# Patient Record
Sex: Female | Born: 1990
Health system: Southern US, Community
[De-identification: ages and names within clinical notes are randomized; demographics above are authoritative.]

## PROBLEM LIST (undated history)

## (undated) DIAGNOSIS — T7840XA Allergy, unspecified, initial encounter: Secondary | ICD-10-CM

## (undated) HISTORY — DX: Allergy, unspecified, initial encounter: T78.40XA

---

## 2013-04-09 ENCOUNTER — Encounter (HOSPITAL_COMMUNITY): Payer: Self-pay | Admitting: Emergency Medicine

## 2013-04-09 ENCOUNTER — Emergency Department (HOSPITAL_COMMUNITY)
Admission: EM | Admit: 2013-04-09 | Discharge: 2013-04-09 | Disposition: A | Discharge status: Home / Self Care | Payer: Federal, State, Local not specified - PPO | Source: Home / Self Care

## 2013-04-09 DIAGNOSIS — R1110 Vomiting, unspecified: Secondary | ICD-10-CM

## 2013-04-09 MED ORDER — ONDANSETRON 4 MG PO TBDP
8.0000 mg | ORAL_TABLET | Freq: Once | ORAL | Status: AC
  Administered 2013-04-09: 8 mg via ORAL

## 2013-04-09 MED ORDER — ONDANSETRON 4 MG PO TBDP
ORAL_TABLET | ORAL | Status: AC
  Filled 2013-04-09: qty 2

## 2013-04-09 MED ORDER — ONDANSETRON HCL 4 MG PO TABS: 4.0000 mg | ORAL_TABLET | Freq: Four times a day (QID) | ORAL | Status: DC

## 2013-04-09 NOTE — ED Notes (Signed)
Vomiting, onset this morning 

## 2013-04-09 NOTE — ED Notes (Signed)
Spoke to dr kindl regarding note for work.  May go back to work [employer]

## 2013-04-09 NOTE — ED Provider Notes (Signed)
CSN: [id number]     Arrival date & time 04/09/13  1552 History   None    Chief Complaint  Patient presents with  . Emesis   (Consider location/radiation/quality/duration/timing/severity/associated sxs/prior Treatment) Patient is a 22 y.o. female presenting with vomiting. The history is provided by the patient.  Emesis Severity:  Mild Duration:  10 hours Timing:  Intermittent Quality:  Stomach contents Progression:  Unchanged Chronicity:  New Recent urination:  Normal Associated symptoms: no diarrhea   Risk factors: not pregnant now and no sick contacts   Risk factors comment:  Onset of menses today   History reviewed. No pertinent past medical history. History reviewed. No pertinent past surgical history. No family history on file. History  Substance Use Topics  . Smoking status: Never Smoker   . Smokeless tobacco: Not on file  . Alcohol Use: Yes   OB History   Grav Para Term Preterm Abortions TAB SAB Ect Mult Living                 Review of Systems  Constitutional: Negative.   HENT: Negative.   Gastrointestinal: Positive for nausea and vomiting. Negative for diarrhea, constipation and blood in stool.  Genitourinary: Positive for pelvic pain.    Allergies  Review of patient's allergies indicates no known allergies.  Home Medications   Current Outpatient Rx  Name  Route  Sig  Dispense  Refill  . ondansetron (ZOFRAN) 4 MG tablet   Oral   Take 1 tablet (4 mg total) by mouth every 6 (six) hours. Prn n/v   8 tablet   0    BP 120/67  Pulse 62  Temp(Src) 98.4 F (36.9 C) (Oral)  Resp 15  SpO2 99%  LMP 04/09/2013 Physical Exam  Nursing note and vitals reviewed. Constitutional: She appears well-developed and well-nourished.  Abdominal: Soft. She exhibits no distension and no mass. Bowel sounds are increased. There is generalized tenderness. There is no rigidity, no rebound, no guarding and no CVA tenderness.  Skin: Skin is warm and dry.    ED Course   Procedures (including critical care time) Labs Review Labs Reviewed - No data to display Imaging Review No results found.  EKG Interpretation    Date/Time:    Ventricular Rate:    PR Interval:    QRS Duration:   QT Interval:    QTC Calculation:   R Axis:     Text Interpretation:              MDM   1. Vomiting alone        Linna Hoff, MD 04/09/13 [phone]

## 2013-04-09 NOTE — Discharge Instructions (Signed)
Clear liquid diet tonight as tolerated, advance on tues as improved, use medicine as needed, return or see your doctor if any problems.  °

## 2013-09-14 ENCOUNTER — Ambulatory Visit: Payer: Federal, State, Local not specified - PPO | Admitting: Family Medicine

## 2013-09-14 ENCOUNTER — Ambulatory Visit (INDEPENDENT_AMBULATORY_CARE_PROVIDER_SITE_OTHER): Payer: Federal, State, Local not specified - PPO | Admitting: Family Medicine

## 2013-09-14 ENCOUNTER — Encounter: Payer: Self-pay | Admitting: Family Medicine

## 2013-09-14 VITALS — Ht 64.5 in | Wt 130.0 lb

## 2013-09-14 DIAGNOSIS — IMO0001 Reserved for inherently not codable concepts without codable children: Secondary | ICD-10-CM

## 2013-09-14 DIAGNOSIS — Z7189 Other specified counseling: Secondary | ICD-10-CM

## 2013-09-14 DIAGNOSIS — Z23 Encounter for immunization: Secondary | ICD-10-CM

## 2013-09-14 DIAGNOSIS — Z7689 Persons encountering health services in other specified circumstances: Secondary | ICD-10-CM

## 2013-09-14 DIAGNOSIS — Z309 Encounter for contraceptive management, unspecified: Secondary | ICD-10-CM

## 2013-09-14 DIAGNOSIS — Z3042 Encounter for surveillance of injectable contraceptive: Secondary | ICD-10-CM | POA: Insufficient documentation

## 2013-09-14 LAB — CBC WITH DIFFERENTIAL/PLATELET
BASOS ABS: 0 10*3/uL (ref 0.0–0.1)
Basophils Relative: 0 % (ref 0–1)
EOS ABS: 0.4 10*3/uL (ref 0.0–0.7)
Eosinophils Relative: 4 % (ref 0–5)
HCT: 33.5 % — ABNORMAL LOW (ref 36.0–46.0)
HEMOGLOBIN: 11.5 g/dL — AB (ref 12.0–15.0)
Lymphocytes Relative: 31 % (ref 12–46)
Lymphs Abs: 2.8 10*3/uL (ref 0.7–4.0)
MCH: 30.3 pg (ref 26.0–34.0)
MCHC: 34.3 g/dL (ref 30.0–36.0)
MCV: 88.4 fL (ref 78.0–100.0)
Monocytes Absolute: 0.6 10*3/uL (ref 0.1–1.0)
Monocytes Relative: 7 % (ref 3–12)
Neutro Abs: 5.3 10*3/uL (ref 1.7–7.7)
Neutrophils Relative %: 58 % (ref 43–77)
PLATELETS: 213 10*3/uL (ref 150–400)
RBC: 3.79 MIL/uL — ABNORMAL LOW (ref 3.87–5.11)
RDW: 15.6 % — AB (ref 11.5–15.5)
WBC: 9.1 10*3/uL (ref 4.0–10.5)

## 2013-09-14 LAB — POCT URINE PREGNANCY: Preg Test, Ur: NEGATIVE

## 2013-09-14 MED ORDER — MEDROXYPROGESTERONE ACETATE 150 MG/ML IM SUSP
150.0000 mg | Freq: Once | INTRAMUSCULAR | Status: AC
  Administered 2013-09-14: 150 mg via INTRAMUSCULAR

## 2013-09-14 NOTE — Patient Instructions (Signed)
Robin Flynn, it was a pleasure seeing you today. Today we established your care. I am getting some blood work today. If there is anything abnormal, you will get a call. You are also getting the Depo-Provera and Gardisil shots today.  Please see me in one year, or sooner if needed. You will be due for your 3 year pap smear next year. I look forward to seeing you again. 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  If you have any questions or concerns, please do not hesitate to call the office at [phone].  Sincerely,  Jacquelin Hawkingalph Ayris Carano, MD

## 2013-09-14 NOTE — Assessment & Plan Note (Signed)
Patient receiving initial shot today. Will follow-up in 3 months.

## 2013-09-14 NOTE — Progress Notes (Signed)
   Subjective:    Patient ID: Robin Flynn, female    DOB: [birth year], 23 y.o.   MRN: [id number]  HPI Patient presents today for a new patient visit and establishment of care. Her additional concerns include getting a Depo-Provera shot. She would like the shot to control her menstrual cycles and is not seeking contraceptive uses. No history of blood clots.  Past Medical History  Diagnosis Date  . Allergy    Family History  Problem Relation Age of Onset  . Cancer Paternal Grandmother     lung   History   Social History  . Marital Status: Significant Other: Female   Social History Main Topics  . Smoking status: Smokes one cigarette infrequently  . Smokeless tobacco: Not on file  . Alcohol Use: 1.5 oz/week    3 drink(s) per week  . Drug Use: No  . Sexual Activity: Yes    Review of Systems  All other systems reviewed and are negative.      Objective:   Physical Exam  Constitutional: She is oriented to person, place, and time. She appears well-developed and well-nourished.  HENT:  Right Ear: Tympanic membrane normal.  Nose: Nose normal.  Mouth/Throat: Oropharynx is clear and moist and mucous membranes are normal.  Right TM not visualized due to cerumen  Eyes: Conjunctivae and EOM are normal. Pupils are equal, round, and reactive to light.  Neck: Normal range of motion. Neck supple.  Cardiovascular: Normal rate, regular rhythm, normal heart sounds, intact distal pulses and normal pulses.   Pulmonary/Chest: Effort normal and breath sounds normal. No respiratory distress. She has no wheezes.  Abdominal: Soft. Normal appearance and bowel sounds are normal. There is no hepatosplenomegaly. There is no tenderness.  Musculoskeletal: Normal range of motion.  Lymphadenopathy:    She has no cervical adenopathy.  Neurological: She is alert and oriented to person, place, and time.  Could not illicit DTRs  Skin: Skin is warm, dry and intact.  Many tattoos        Assessment &  Plan:

## 2013-09-15 LAB — COMPREHENSIVE METABOLIC PANEL
ALBUMIN: 3.9 g/dL (ref 3.5–5.2)
ALK PHOS: 61 U/L (ref 39–117)
ALT: 10 U/L (ref 0–35)
AST: 17 U/L (ref 0–37)
BILIRUBIN TOTAL: 0.4 mg/dL (ref 0.2–1.2)
BUN: 8 mg/dL (ref 6–23)
CO2: 29 mEq/L (ref 19–32)
Calcium: 8.8 mg/dL (ref 8.4–10.5)
Chloride: 102 mEq/L (ref 96–112)
Creat: 0.59 mg/dL (ref 0.50–1.10)
GLUCOSE: 80 mg/dL (ref 70–99)
POTASSIUM: 3.8 meq/L (ref 3.5–5.3)
Sodium: 137 mEq/L (ref 135–145)
TOTAL PROTEIN: 6.8 g/dL (ref 6.0–8.3)

## 2013-09-15 LAB — RPR

## 2013-09-15 LAB — HIV ANTIBODY (ROUTINE TESTING W REFLEX): HIV 1&2 Ab, 4th Generation: NONREACTIVE

## 2013-09-17 ENCOUNTER — Telehealth: Payer: Self-pay | Admitting: *Deleted

## 2013-09-17 NOTE — Telephone Encounter (Signed)
Left message on patient's voicemail.Robin Flynn  

## 2013-09-17 NOTE — Telephone Encounter (Signed)
Message copied by Tanna SavoyPROPOSITO, Robin Flynn on Mon Sep 17, 2013  9:09 AM ------      Message from: Jacquelin HawkingNETTEY, Robin A      Created: Sun Sep 16, 2013 10:11 PM       Patient has some mild anemia, which is generally expected in a menstruating female. Rest of labs are normal. HIV and syphilis screen are negative. Will need to inform patient that she needs to follow-up with me in 3 months since she is getting Depo-Provera. ------

## 2013-09-20 ENCOUNTER — Encounter: Payer: Self-pay | Admitting: Family Medicine

## 2013-09-20 ENCOUNTER — Ambulatory Visit (INDEPENDENT_AMBULATORY_CARE_PROVIDER_SITE_OTHER): Payer: Federal, State, Local not specified - PPO | Admitting: Family Medicine

## 2013-09-20 VITALS — BP 115/55 | HR 67 | Temp 98.2°F | Ht 64.5 in | Wt 131.0 lb

## 2013-09-20 DIAGNOSIS — M549 Dorsalgia, unspecified: Secondary | ICD-10-CM

## 2013-09-20 LAB — TSH: TSH: 0.315 u[IU]/mL — ABNORMAL LOW (ref 0.350–4.500)

## 2013-09-20 LAB — POCT HEMOGLOBIN: HEMOGLOBIN: 11.6 g/dL — AB (ref 12.2–16.2)

## 2013-09-20 LAB — D-DIMER, QUANTITATIVE: D-Dimer, Quant: 0.23 ug/mL-FEU (ref 0.00–0.48)

## 2013-09-20 MED ORDER — IBUPROFEN 600 MG PO TABS: 600.0000 mg | ORAL_TABLET | Freq: Four times a day (QID) | ORAL | Status: DC | PRN

## 2013-09-20 NOTE — Patient Instructions (Signed)
Robin Flynn, it was a pleasure seeing you today. Today we talked about your back pain. On your examination, it seems like it is related to your muscles/bones. I got some blood from you to make sure there is not something worse. In the mean time I will prescribe ibuprofen for you to hopefully help with what I suspect is inflammation.  Please schedule an appointment to see me in one week. If your symptoms go away, you can call to reschedule for 4 weeks.  If you have any questions or concerns, please do not hesitate to call the office at [phone].  Sincerely,  Jacquelin Hawkingalph Chiyeko Ferre, MD

## 2013-09-22 DIAGNOSIS — M549 Dorsalgia, unspecified: Secondary | ICD-10-CM | POA: Insufficient documentation

## 2013-09-22 NOTE — Progress Notes (Signed)
   Subjective:    Patient ID: Robin Flynn, female    DOB: August 01, 1990, 23 y.o.   MRN: [id number]  HPI  Patient presents to clinic today with a one week history of back pack. Pain is sharp and located in thoracic spine area. Pain is constant and located throughout thoracic back. Sometimes worse on right and sometimes worse on left. Pain also in chest at times. 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 She has applied heat and soaking which has helped.   Past Medical History  Diagnosis Date  . Allergy    Review of Systems  Respiratory: Negative for shortness of breath.   Cardiovascular: Positive for chest pain. Negative for palpitations.  Musculoskeletal: Positive for back pain.  All other systems reviewed and are negative.      Objective:  BP 115/55  Pulse 67  Temp(Src) 98.2 F (36.8 C) (Oral)  Ht 5' 4.5" (1.638 m)  Wt 131 lb (59.421 kg)  BMI 22.15 kg/m2  SpO2 99%  LMP 09/13/2013  Physical Exam  Constitutional: She appears well-developed and well-nourished.  Cardiovascular: Normal rate, regular rhythm and normal heart sounds.   Pulmonary/Chest: Effort normal and breath sounds normal. No respiratory distress. She has no wheezes. She has no rales. She exhibits tenderness.  Musculoskeletal:       Thoracic back: She exhibits tenderness (tenderness across whole thoracic back. No bony tenderness. No signs of trauma). She exhibits no swelling, no edema and no deformity.          Assessment & Plan:

## 2013-09-22 NOTE — Assessment & Plan Note (Signed)
Seems musculoskeletal. Will get d-dimer to rule out PE. Will also get TSH. Ibuprofen for pain control.

## 2013-12-06 ENCOUNTER — Ambulatory Visit (INDEPENDENT_AMBULATORY_CARE_PROVIDER_SITE_OTHER): Payer: Federal, State, Local not specified - PPO | Admitting: *Deleted

## 2013-12-06 DIAGNOSIS — Z3049 Encounter for surveillance of other contraceptives: Secondary | ICD-10-CM | POA: Diagnosis not present

## 2013-12-06 DIAGNOSIS — Z3042 Encounter for surveillance of injectable contraceptive: Secondary | ICD-10-CM

## 2013-12-06 MED ORDER — MEDROXYPROGESTERONE ACETATE 150 MG/ML IM SUSP
150.0000 mg | Freq: Once | INTRAMUSCULAR | Status: AC
  Administered 2013-12-06: 150 mg via INTRAMUSCULAR

## 2013-12-06 NOTE — Progress Notes (Signed)
Patient in today for depo-provera. Injection given in left ventrogluteal, patient without complaints. Next depo due: October 15 - October 29, patient aware.

## 2014-02-28 ENCOUNTER — Ambulatory Visit (INDEPENDENT_AMBULATORY_CARE_PROVIDER_SITE_OTHER): Payer: Federal, State, Local not specified - PPO | Admitting: *Deleted

## 2014-02-28 DIAGNOSIS — Z3042 Encounter for surveillance of injectable contraceptive: Secondary | ICD-10-CM | POA: Diagnosis not present

## 2014-02-28 MED ORDER — MEDROXYPROGESTERONE ACETATE 150 MG/ML IM SUSP
150.0000 mg | Freq: Once | INTRAMUSCULAR | Status: AC
  Administered 2014-02-28: 150 mg via INTRAMUSCULAR

## 2014-02-28 NOTE — Progress Notes (Signed)
   Pt in for Depo Provera injection.  Pt tolerated Depo injection. Depo given right upper outer quadrant.  Next injection due Jan 7-May 30, 2014.  Reminder card given. Clovis PuMartin, Tamika L, RN

## 2014-05-20 ENCOUNTER — Ambulatory Visit: Payer: Federal, State, Local not specified - PPO

## 2014-05-22 ENCOUNTER — Ambulatory Visit (INDEPENDENT_AMBULATORY_CARE_PROVIDER_SITE_OTHER): Payer: BLUE CROSS/BLUE SHIELD | Admitting: *Deleted

## 2014-05-22 DIAGNOSIS — Z3042 Encounter for surveillance of injectable contraceptive: Secondary | ICD-10-CM

## 2014-05-22 MED ORDER — MEDROXYPROGESTERONE ACETATE 150 MG/ML IM SUSP
150.0000 mg | Freq: Once | INTRAMUSCULAR | Status: AC
  Administered 2014-05-22: 150 mg via INTRAMUSCULAR

## 2014-05-22 NOTE — Progress Notes (Signed)
   Pt in for Depo Provera injection.  Pt tolerated Depo injection. Depo given left upper outer quadrant.  Next injection due March 31-August 22, 2014.  Reminder card given. Martin, Tamika L, RN   

## 2014-09-17 ENCOUNTER — Ambulatory Visit (INDEPENDENT_AMBULATORY_CARE_PROVIDER_SITE_OTHER): Payer: Federal, State, Local not specified - PPO | Admitting: Family Medicine

## 2014-09-17 ENCOUNTER — Ambulatory Visit (INDEPENDENT_AMBULATORY_CARE_PROVIDER_SITE_OTHER): Payer: Federal, State, Local not specified - PPO | Admitting: *Deleted

## 2014-09-17 ENCOUNTER — Encounter: Payer: Self-pay | Admitting: Family Medicine

## 2014-09-17 VITALS — BP 117/76 | HR 79 | Temp 100.0°F | Ht 64.5 in | Wt 129.0 lb

## 2014-09-17 DIAGNOSIS — J020 Streptococcal pharyngitis: Secondary | ICD-10-CM | POA: Diagnosis not present

## 2014-09-17 DIAGNOSIS — J069 Acute upper respiratory infection, unspecified: Secondary | ICD-10-CM

## 2014-09-17 DIAGNOSIS — Z3042 Encounter for surveillance of injectable contraceptive: Secondary | ICD-10-CM

## 2014-09-17 LAB — POCT RAPID STREP A (OFFICE): Rapid Strep A Screen: NEGATIVE

## 2014-09-17 LAB — POCT URINE PREGNANCY: Preg Test, Ur: NEGATIVE

## 2014-09-17 MED ORDER — BENZONATATE 100 MG PO CAPS: 100.0000 mg | ORAL_CAPSULE | Freq: Three times a day (TID) | ORAL | Status: DC | PRN

## 2014-09-17 MED ORDER — FLUTICASONE PROPIONATE 50 MCG/ACT NA SUSP: 2.0000 | Freq: Every day | NASAL | Status: DC

## 2014-09-17 MED ORDER — IBUPROFEN 600 MG PO TABS: 600.0000 mg | ORAL_TABLET | Freq: Four times a day (QID) | ORAL | Status: DC | PRN

## 2014-09-17 MED ORDER — MEDROXYPROGESTERONE ACETATE 150 MG/ML IM SUSP
150.0000 mg | Freq: Once | INTRAMUSCULAR | Status: AC
  Administered 2014-09-17: 150 mg via INTRAMUSCULAR

## 2014-09-17 NOTE — Patient Instructions (Signed)
It was nice to see you today.  Your strep test was negative.  This is viral in nature.  I have prescribed Ibuprofen, Tessalon (for cough) and Flonase (for your runny nose/congestion). Use them as prescribed.  Follow up if you worsen or fail to improve.   Take care  Dr. Adriana Simasook

## 2014-09-17 NOTE — Progress Notes (Signed)
   Subjective:    Patient ID: Pieter Partridgeachel Snead, female    DOB: [birth year], 24 y.o.   MRN: [id number]  HPI 24 year old female presents for a same day appointment with complaints of sore throat.  1) Sore Throat  Started on Saturday.  Mild to moderate in severity.  No exacerbating or relieving factors.  She notes associated runny nose, cough.  No fever, chills.  No sick contacts.  No interventions tried.   Review of Systems  Constitutional: Negative for fever and chills.  HENT: Positive for rhinorrhea and sore throat.   Respiratory: Positive for cough.       Objective:   Physical Exam Filed Vitals:   09/17/14 1154  BP: 117/76  Pulse: 79  Temp: 100 F (37.8 C)   Vital signs reviewed.  Exam: General: well appearing female in NAD.  HEENT: NCAT. Normal TM's bilaterally. Oropharynx mildly erythematous. No tonsillar exudate.  Cardiovascular: RRR. No murmurs, rubs, or gallops. Respiratory: CTAB. No rales, rhonchi, or wheeze.    Assessment & Plan:  See Problem List

## 2014-09-17 NOTE — Progress Notes (Signed)
   Pt late for Depo Provera injection.  Pregnancy test ordered; result negative. Pt tolerated Depo injection. Depo given right upper outer quadrant.  Next injection due July 26-December 17, 2014.  Reminder card given. Clovis PuMartin, Nahiara Kretzschmar L, RN

## 2014-09-17 NOTE — Assessment & Plan Note (Signed)
Rapid strep negative. I suspect that her symptoms are secondary to viral URI. Supportive care; Also, PRN Ibuprofen for sore throat, Tessalon for cough, Flonase for congestion/rhinorrhea.

## 2015-01-08 ENCOUNTER — Ambulatory Visit (INDEPENDENT_AMBULATORY_CARE_PROVIDER_SITE_OTHER): Payer: Federal, State, Local not specified - PPO | Admitting: *Deleted

## 2015-01-08 DIAGNOSIS — Z3042 Encounter for surveillance of injectable contraceptive: Secondary | ICD-10-CM | POA: Diagnosis not present

## 2015-01-08 LAB — POCT URINE PREGNANCY: Preg Test, Ur: NEGATIVE

## 2015-01-08 MED ORDER — MEDROXYPROGESTERONE ACETATE 150 MG/ML IM SUSP
150.0000 mg | Freq: Once | INTRAMUSCULAR | Status: AC
  Administered 2015-01-08: 150 mg via INTRAMUSCULAR

## 2015-01-08 NOTE — Progress Notes (Signed)
   Pt late for Depo Provera injection.  Pregnancy test ordered; results negative. Pt tolerated Depo injection. Depo given left upper outer quadrant.  Next injection due Nov. 16-Nov. 30, 2016.  Reminder card given. Clovis Pu, RN

## 2015-01-22 ENCOUNTER — Encounter (HOSPITAL_COMMUNITY): Payer: Self-pay | Admitting: Emergency Medicine

## 2015-01-22 ENCOUNTER — Emergency Department (HOSPITAL_COMMUNITY)
Admission: EM | Admit: 2015-01-22 | Discharge: 2015-01-22 | Disposition: A | Discharge status: Home / Self Care | Payer: Federal, State, Local not specified - PPO | Source: Home / Self Care | Attending: Emergency Medicine | Admitting: Emergency Medicine

## 2015-01-22 DIAGNOSIS — R6889 Other general symptoms and signs: Secondary | ICD-10-CM

## 2015-01-22 LAB — POCT RAPID STREP A: STREPTOCOCCUS, GROUP A SCREEN (DIRECT): NEGATIVE

## 2015-01-22 MED ORDER — CETIRIZINE HCL 10 MG PO TABS: 10.0000 mg | ORAL_TABLET | Freq: Every day | ORAL | Status: DC

## 2015-01-22 MED ORDER — IPRATROPIUM BROMIDE 0.06 % NA SOLN: 2.0000 | Freq: Four times a day (QID) | NASAL | Status: DC

## 2015-01-22 NOTE — Discharge Instructions (Signed)
You have the flu or something similar to the flu. This will run its course in 7-10 days. Takes cetirizine daily. Use Atrovent nasal spray 4 times a day. This will help with the nasal symptoms. Get over-the-counter Afrin nasal spray. This will help with the stuffy nose. You can only use this for 3 days. You can take Tylenol or ibuprofen as needed for fevers. You should start to feel better over the weekend. Follow-up as needed.

## 2015-01-22 NOTE — ED Provider Notes (Signed)
CSN: [id number]     Arrival date & time 01/22/15  1310 History   First MD Initiated Contact with Patient 01/22/15 1356     Chief Complaint  Patient presents with  . Otalgia  . Sore Throat   (Consider location/radiation/quality/duration/timing/severity/associated sxs/prior Treatment) HPI  She is a 24 year old woman here for evaluation of sore throat. She states her symptoms started on Sunday with sore throat, nasal congestion, rhinorrhea, subjective fevers, body aches, and cough. She also reports bilateral ear pain, worse on the right. She states this is been a ongoing problem for several months, but got worse in the last few days. She denies any shortness of breath or wheezing. She has tried Tylenol, Advil, DayQuil, NyQuil without improvement.  Past Medical History  Diagnosis Date  . Allergy    History reviewed. No pertinent past surgical history. Family History  Problem Relation Age of Onset  . Cancer Paternal Grandmother     lung   Social History  Substance Use Topics  . Smoking status: Never Smoker   . Smokeless tobacco: None  . Alcohol Use: 1.5 oz/week    3 drink(s) per week   OB History    No data available     Review of Systems As in history of present illness Allergies  Review of patient's allergies indicates no known allergies.  Home Medications   Prior to Admission medications   Medication Sig Start Date End Date Taking? Authorizing Provider  cetirizine (ZYRTEC) 10 MG tablet Take 1 tablet (10 mg total) by mouth daily. 01/22/15   Charm Rings, MD  fluticasone (FLONASE) 50 MCG/ACT nasal spray Place 2 sprays into both nostrils daily. 09/17/14   Tommie Sams, DO  ibuprofen (ADVIL,MOTRIN) 600 MG tablet Take 1 tablet (600 mg total) by mouth every 6 (six) hours as needed. 09/17/14   Jayce G Cook, DO  ipratropium (ATROVENT) 0.06 % nasal spray Place 2 sprays into both nostrils 4 (four) times daily. 01/22/15   Charm Rings, MD   Meds Ordered and Administered this Visit   Medications - No data to display  BP 111/72 mmHg  Pulse 68  Temp(Src) 100.2 F (37.9 C) (Oral)  Resp 18  SpO2 100% No data found.   Physical Exam  Constitutional: She is oriented to person, place, and time. She appears well-developed and well-nourished. No distress.  HENT:  Mouth/Throat: Oropharynx is clear and moist. No oropharyngeal exudate.  Nasal discharge present. Left TM is retracted. Right ear canal is occluded by cerumen.  Eyes: Conjunctivae are normal.  Neck: Neck supple.  Cardiovascular: Normal rate, regular rhythm and normal heart sounds.   Pulmonary/Chest: Effort normal and breath sounds normal. No respiratory distress. She has no wheezes. She has no rales.  Lymphadenopathy:    She has no cervical adenopathy.  Neurological: She is alert and oriented to person, place, and time.    ED Course  Procedures (including critical care time)  Labs Review Labs Reviewed  POCT RAPID STREP A    Imaging Review No results found.    MDM   1. Flu-like symptoms    Right ear washed out.   Right tympanic membrane is slightly retracted, otherwise normal.  Symptoms are consistent with a flulike illness. She is outside the treatment window for Tamiflu. Symptomatic treatment with Atrovent nasal spray, cetirizine, and over-the-counter Afrin. School note provided. Follow-up as needed.    Charm Rings, MD 01/22/15 1500

## 2015-01-22 NOTE — ED Notes (Signed)
C/o bilateral ear pain and throat pain States she has throbbing ear pain Little cough Does have hx of ear not draining well Did vomit x 1

## 2015-01-24 LAB — CULTURE, GROUP A STREP: Strep A Culture: NEGATIVE

## 2015-01-24 NOTE — ED Notes (Signed)
Final report of strep negative  

## 2015-02-24 ENCOUNTER — Ambulatory Visit: Payer: Federal, State, Local not specified - PPO | Admitting: Family Medicine

## 2015-03-27 ENCOUNTER — Ambulatory Visit (INDEPENDENT_AMBULATORY_CARE_PROVIDER_SITE_OTHER): Payer: Federal, State, Local not specified - PPO | Admitting: *Deleted

## 2015-03-27 DIAGNOSIS — Z3042 Encounter for surveillance of injectable contraceptive: Secondary | ICD-10-CM | POA: Diagnosis not present

## 2015-03-27 MED ORDER — MEDROXYPROGESTERONE ACETATE 150 MG/ML IM SUSP
150.0000 mg | Freq: Once | INTRAMUSCULAR | Status: AC
  Administered 2015-03-27: 150 mg via INTRAMUSCULAR

## 2015-07-01 ENCOUNTER — Ambulatory Visit (INDEPENDENT_AMBULATORY_CARE_PROVIDER_SITE_OTHER): Payer: Federal, State, Local not specified - PPO | Admitting: *Deleted

## 2015-07-01 ENCOUNTER — Telehealth: Payer: Self-pay | Admitting: *Deleted

## 2015-07-01 DIAGNOSIS — Z3042 Encounter for surveillance of injectable contraceptive: Secondary | ICD-10-CM | POA: Diagnosis not present

## 2015-07-01 LAB — POCT URINE PREGNANCY: Preg Test, Ur: NEGATIVE

## 2015-07-01 MED ORDER — MEDROXYPROGESTERONE ACETATE 150 MG/ML IM SUSP
150.0000 mg | Freq: Once | INTRAMUSCULAR | Status: AC
  Administered 2015-07-01: 150 mg via INTRAMUSCULAR

## 2015-07-01 NOTE — Telephone Encounter (Signed)
Spoke with patient today regarding physical and depo provera.  Appt made for Thursday 07/03/15 for annual physical.  Ok to give Depo Provera today per Dr. Jennette Kettle.  Daphine Deutscher, Bronson Ing, RN

## 2015-07-01 NOTE — Progress Notes (Signed)
  Pt late for Depo Provera injection.  Patient does not have a current depo provera order; order given x 1 by Dr. Jennette Kettle.  Appointment for physical 07/03/15 at 2:30 PM.  Pregnancy test order; results negative. Pt tolerated Depo injection. Depo given right upper outer quadrant.  Next injection due May 9-23, 2017.  Reminder card given. Clovis Pu, RN

## 2015-07-01 NOTE — Telephone Encounter (Signed)
Left voice message for patient that she does not have a current order for Depo Provera.  Patient to need to see provider for physical and current Depo Provera order.  Clovis Pu, RN

## 2015-07-03 ENCOUNTER — Other Ambulatory Visit (HOSPITAL_COMMUNITY)
Admission: RE | Admit: 2015-07-03 | Discharge: 2015-07-03 | Disposition: A | Discharge status: Home / Self Care | Payer: Federal, State, Local not specified - PPO | Source: Ambulatory Visit | Attending: Family Medicine | Admitting: Family Medicine

## 2015-07-03 ENCOUNTER — Ambulatory Visit (INDEPENDENT_AMBULATORY_CARE_PROVIDER_SITE_OTHER): Payer: Federal, State, Local not specified - PPO | Admitting: Family Medicine

## 2015-07-03 ENCOUNTER — Other Ambulatory Visit: Payer: Self-pay | Admitting: Family Medicine

## 2015-07-03 ENCOUNTER — Encounter: Payer: Self-pay | Admitting: Family Medicine

## 2015-07-03 VITALS — BP 114/56 | HR 53 | Temp 98.9°F | Wt 126.0 lb

## 2015-07-03 DIAGNOSIS — M25531 Pain in right wrist: Secondary | ICD-10-CM

## 2015-07-03 DIAGNOSIS — Z124 Encounter for screening for malignant neoplasm of cervix: Secondary | ICD-10-CM

## 2015-07-03 DIAGNOSIS — Z01419 Encounter for gynecological examination (general) (routine) without abnormal findings: Secondary | ICD-10-CM

## 2015-07-03 DIAGNOSIS — G8929 Other chronic pain: Secondary | ICD-10-CM

## 2015-07-03 DIAGNOSIS — Z113 Encounter for screening for infections with a predominantly sexual mode of transmission: Secondary | ICD-10-CM | POA: Diagnosis present

## 2015-07-03 DIAGNOSIS — Z3042 Encounter for surveillance of injectable contraceptive: Secondary | ICD-10-CM

## 2015-07-03 DIAGNOSIS — Z23 Encounter for immunization: Secondary | ICD-10-CM | POA: Diagnosis not present

## 2015-07-03 LAB — COMPREHENSIVE METABOLIC PANEL
ALBUMIN: 4.8 g/dL (ref 3.6–5.1)
ALT: 15 U/L (ref 6–29)
AST: 22 U/L (ref 10–30)
Alkaline Phosphatase: 64 U/L (ref 33–115)
BUN: 8 mg/dL (ref 7–25)
CALCIUM: 9.6 mg/dL (ref 8.6–10.2)
CHLORIDE: 100 mmol/L (ref 98–110)
CO2: 26 mmol/L (ref 20–31)
Creat: 0.67 mg/dL (ref 0.50–1.10)
Glucose, Bld: 67 mg/dL (ref 65–99)
POTASSIUM: 3.3 mmol/L — AB (ref 3.5–5.3)
Sodium: 137 mmol/L (ref 135–146)
TOTAL PROTEIN: 8 g/dL (ref 6.1–8.1)
Total Bilirubin: 0.5 mg/dL (ref 0.2–1.2)

## 2015-07-03 LAB — LIPID PANEL
CHOL/HDL RATIO: 2.9 ratio (ref <=5.0)
CHOLESTEROL: 163 mg/dL (ref 125–200)
HDL: 56 mg/dL (ref >=46)
LDL CALC: 93 mg/dL (ref <130)
TRIGLYCERIDES: 68 mg/dL (ref <150)
VLDL: 14 mg/dL (ref <30)

## 2015-07-03 LAB — TSH: TSH: 0.37 m[IU]/L — AB

## 2015-07-03 LAB — CBC
HCT: 37.4 % (ref 36.0–46.0)
HEMOGLOBIN: 12.6 g/dL (ref 12.0–15.0)
MCH: 30.1 pg (ref 26.0–34.0)
MCHC: 33.7 g/dL (ref 30.0–36.0)
MCV: 89.3 fL (ref 78.0–100.0)
MPV: 11.3 fL (ref 8.6–12.4)
Platelets: 235 10*3/uL (ref 150–400)
RBC: 4.19 MIL/uL (ref 3.87–5.11)
RDW: 14.2 % (ref 11.5–15.5)
WBC: 11.4 10*3/uL — ABNORMAL HIGH (ref 4.0–10.5)

## 2015-07-03 MED ORDER — MEDROXYPROGESTERONE ACETATE 150 MG/ML IM SUSP: 150.0000 mg | INTRAMUSCULAR | Status: AC

## 2015-07-03 MED ORDER — DICLOFENAC SODIUM 75 MG PO TBEC: 75.0000 mg | DELAYED_RELEASE_TABLET | Freq: Two times a day (BID) | ORAL | Status: DC

## 2015-07-03 NOTE — Progress Notes (Signed)
  Subjective:     Robin Flynn is a 25 y.o. female and is here for a comprehensive physical exam. The patient reports problems - reports wrist pain on right for 2 months. No known injury. worse with dorsiflexion. Has been to urgent care with negative x-rays. Desires STD screen..  Social History   Social History  . Marital Status: Significant Other    Spouse Name: N/A  . Number of Children: N/A  . Years of Education: N/A   Occupational History  . Not on file.   Social History Main Topics  . Smoking status: Never Smoker   . Smokeless tobacco: Not on file  . Alcohol Use: 1.5 oz/week    3 drink(s) per week  . Drug Use: No  . Sexual Activity: Yes   Other Topics Concern  . Not on file   Social History Narrative   Health Maintenance  Topic Date Due  . Janet Berlin  05/20/2009  . PAP SMEAR  05/21/2011  . INFLUENZA VACCINE  07/02/2016 (Originally 12/09/2014)  . HIV Screening  Completed    The following portions of the patient's history were reviewed and updated as appropriate: allergies, current medications, past family history, past medical history, past social history, past surgical history and problem list.  Review of Systems Pertinent items noted in HPI and remainder of comprehensive ROS otherwise negative.   Objective:    BP 114/56 mmHg  Pulse 53  Temp(Src) 98.9 F (37.2 C) (Oral)  Wt 126 lb (57.153 kg) General appearance: alert, cooperative and appears stated age Head: Normocephalic, without obvious abnormality, atraumatic Neck: no adenopathy, supple, symmetrical, trachea midline and thyroid not enlarged, symmetric, no tenderness/mass/nodules Lungs: clear to auscultation bilaterally Breasts: normal appearance, no masses or tenderness Heart: regular rate and rhythm, S1, S2 normal, no murmur, click, rub or gallop Abdomen: soft, non-tender; bowel sounds normal; no masses,  no organomegaly Pelvic: cervix normal in appearance, external genitalia normal, no adnexal masses  or tenderness, no cervical motion tenderness, uterus normal size, shape, and consistency and vagina normal without discharge Extremities: extremities normal, atraumatic, no cyanosis or edema Pulses: 2+ and symmetric Skin: Skin color, texture, turgor normal. No rashes or lesions Lymph nodes: Cervical, supraclavicular, and axillary nodes normal. Neurologic: Grossly normal    Assessment:    Healthy female exam.      Plan:     1. Encounter for surveillance of injectable contraceptive Continue Depo--no cycles with this - medroxyPROGESTERone (DEPO-PROVERA) 150 MG/ML injection; Inject 1 mL (150 mg total) into the muscle every 3 (three) months.  Dispense: 1 mL; Refill: 11  2. Screen for STD (sexually transmitted disease)  - HIV antibody - RPR - Hepatitis B surface antigen - Hepatitis C antibody  3. Screening for malignant neoplasm of cervix  - Cytology - PAP  4. Encounter for routine gynecological examination  - TSH - CBC - Comprehensive metabolic panel - Lipid panel  5. Wrist pain, chronic, right Trial of NSAIDS - diclofenac (VOLTAREN) 75 MG EC tablet; Take 1 tablet (75 mg total) by mouth 2 (two) times daily.  Dispense: 30 tablet; Refill: 0  6. Need for Tdap vaccination Update immunization - Tdap vaccine greater than or equal to 7yo IM  See After Visit Summary for Counseling Recommendations

## 2015-07-03 NOTE — Patient Instructions (Signed)
Wrist Pain There are many things that can cause wrist pain. Some common causes include:  An injury to the wrist area, such as a sprain, strain, or fracture.  Overuse of the joint.  A condition that causes increased pressure on a nerve in the wrist (carpal tunnel syndrome).  Wear and tear of the joints that occurs with aging (osteoarthritis).  A variety of other types of arthritis. Sometimes, the cause of wrist pain is not known. The pain often goes away when you follow your health care provider's instructions for relieving pain at home. If your wrist pain continues, tests may need to be done to diagnose your condition. HOME CARE INSTRUCTIONS Pay attention to any changes in your symptoms. Take these actions to help with your pain:  Rest the wrist area for at least 48 hours or as told by your health care provider.  If directed, apply ice to the injured area:  Put ice in a plastic bag.  Place a towel between your skin and the bag.  Leave the ice on for 20 minutes, 2-3 times per day.  Keep your arm raised (elevated) above the level of your heart while you are sitting or lying down.  If a splint or elastic bandage has been applied, use it as told by your health care provider.  Remove the splint or bandage only as told by your health care provider.  Loosen the splint or bandage if your fingers become numb or have a tingling feeling, or if they turn cold or blue.  Take over-the-counter and prescription medicines only as told by your health care provider.  Keep all follow-up visits as told by your health care provider. This is important. SEEK MEDICAL CARE IF:  Your pain is not helped by treatment.  Your pain gets worse. SEEK IMMEDIATE MEDICAL CARE IF:  Your fingers become swollen.  Your fingers turn white, very red, or cold and blue.  Your fingers are numb or have a tingling feeling.  You have difficulty moving your fingers.   This information is not intended to replace  advice given to you by your health care provider. Make sure you discuss any questions you have with your health care provider.   Document Released: 02/03/2005 Document Revised: 01/15/2015 Document Reviewed: 09/11/2014 Elsevier Interactive Patient Education 2016 [place] for Adults, Female A healthy lifestyle and preventive care can promote health and wellness. Preventive health guidelines for women include the following key practices.  A routine yearly physical is a good way to check with your health care provider about your health and preventive screening. It is a chance to share any concerns and updates on your health and to receive a thorough exam.  Visit your dentist for a routine exam and preventive care every 6 months. Brush your teeth twice a day and floss once a day. Good oral hygiene prevents tooth decay and gum disease.  The frequency of eye exams is based on your age, health, family medical history, use of contact lenses, and other factors. Follow your health care provider's recommendations for frequency of eye exams.  Eat a healthy diet. Foods like vegetables, fruits, whole grains, low-fat dairy products, and lean protein foods contain the nutrients you need without too many calories. Decrease your intake of foods high in solid fats, added sugars, and salt. Eat the right amount of calories for you.Get information about a proper diet from your health care provider, if necessary.  Regular physical exercise is one of the most important  things you can do for your health. Most adults should get at least 150 minutes of moderate-intensity exercise (any activity that increases your heart rate and causes you to sweat) each week. In addition, most adults need muscle-strengthening exercises on 2 or more days a week.  Maintain a healthy weight. The body mass index (BMI) is a screening tool to identify possible weight problems. It provides an estimate of body fat based on  height and weight. Your health care provider can find your BMI and can help you achieve or maintain a healthy weight.For adults 20 years and older:  A BMI below 18.5 is considered underweight.  A BMI of 18.5 to 24.9 is normal.  A BMI of 25 to 29.9 is considered overweight.  A BMI of 30 and above is considered obese.  Maintain normal blood lipids and cholesterol levels by exercising and minimizing your intake of saturated fat. Eat a balanced diet with plenty of fruit and vegetables. Blood tests for lipids and cholesterol should begin at age 67 and be repeated every 5 years. If your lipid or cholesterol levels are high, you are over 50, or you are at high risk for heart disease, you may need your cholesterol levels checked more frequently.Ongoing high lipid and cholesterol levels should be treated with medicines if diet and exercise are not working.  If you smoke, find out from your health care provider how to quit. If you do not use tobacco, do not start.  Lung cancer screening is recommended for adults aged 55-80 years who are at high risk for developing lung cancer because of a history of smoking. A yearly low-dose CT scan of the lungs is recommended for people who have at least a 30-pack-year history of smoking and are a current smoker or have quit within the past 15 years. A pack year of smoking is smoking an average of 1 pack of cigarettes a day for 1 year (for example: 1 pack a day for 30 years or 2 packs a day for 15 years). Yearly screening should continue until the smoker has stopped smoking for at least 15 years. Yearly screening should be stopped for people who develop a health problem that would prevent them from having lung cancer treatment.  If you are pregnant, do not drink alcohol. If you are breastfeeding, be very cautious about drinking alcohol. If you are not pregnant and choose to drink alcohol, do not have more than 1 drink per day. One drink is considered to be 12 ounces (355  mL) of beer, 5 ounces (148 mL) of wine, or 1.5 ounces (44 mL) of liquor.  Avoid use of street drugs. Do not share needles with anyone. Ask for help if you need support or instructions about stopping the use of drugs.  High blood pressure causes heart disease and increases the risk of stroke. Your blood pressure should be checked at least every 1 to 2 years. Ongoing high blood pressure should be treated with medicines if weight loss and exercise do not work.  If you are 64-48 years old, ask your health care provider if you should take aspirin to prevent strokes.  Diabetes screening is done by taking a blood sample to check your blood glucose level after you have not eaten for a certain period of time (fasting). If you are not overweight and you do not have risk factors for diabetes, you should be screened once every 3 years starting at age 70. If you are overweight or obese and you  are 27-55 years of age, you should be screened for diabetes every year as part of your cardiovascular risk assessment.  Breast cancer screening is essential preventive care for women. You should practice "breast self-awareness." This means understanding the normal appearance and feel of your breasts and may include breast self-examination. Any changes detected, no matter how small, should be reported to a health care provider. Women in their 41s and 30s should have a clinical breast exam (CBE) by a health care provider as part of a regular health exam every 1 to 3 years. After age 43, women should have a CBE every year. Starting at age 9, women should consider having a mammogram (breast X-ray test) every year. Women who have a family history of breast cancer should talk to their health care provider about genetic screening. Women at a high risk of breast cancer should talk to their health care providers about having an MRI and a mammogram every year.  Breast cancer gene (BRCA)-related cancer risk assessment is recommended for  women who have family members with BRCA-related cancers. BRCA-related cancers include breast, ovarian, tubal, and peritoneal cancers. Having family members with these cancers may be associated with an increased risk for harmful changes (mutations) in the breast cancer genes BRCA1 and BRCA2. Results of the assessment will determine the need for genetic counseling and BRCA1 and BRCA2 testing.  Your health care provider may recommend that you be screened regularly for cancer of the pelvic organs (ovaries, uterus, and vagina). This screening involves a pelvic examination, including checking for microscopic changes to the surface of your cervix (Pap test). You may be encouraged to have this screening done every 3 years, beginning at age 45.  For women ages 43-65, health care providers may recommend pelvic exams and Pap testing every 3 years, or they may recommend the Pap and pelvic exam, combined with testing for human papilloma virus (HPV), every 5 years. Some types of HPV increase your risk of cervical cancer. Testing for HPV may also be done on women of any age with unclear Pap test results.  Other health care providers may not recommend any screening for nonpregnant women who are considered low risk for pelvic cancer and who do not have symptoms. Ask your health care provider if a screening pelvic exam is right for you.  If you have had past treatment for cervical cancer or a condition that could lead to cancer, you need Pap tests and screening for cancer for at least 20 years after your treatment. If Pap tests have been discontinued, your risk factors (such as having a new sexual partner) need to be reassessed to determine if screening should resume. Some women have medical problems that increase the chance of getting cervical cancer. In these cases, your health care provider may recommend more frequent screening and Pap tests.  Colorectal cancer can be detected and often prevented. Most routine colorectal  cancer screening begins at the age of 66 years and continues through age 3 years. However, your health care provider may recommend screening at an earlier age if you have risk factors for colon cancer. On a yearly basis, your health care provider may provide home test kits to check for hidden blood in the stool. Use of a small camera at the end of a tube, to directly examine the colon (sigmoidoscopy or colonoscopy), can detect the earliest forms of colorectal cancer. Talk to your health care provider about this at age 61, when routine screening begins. Direct exam of the  colon should be repeated every 5-10 years through age 68 years, unless early forms of precancerous polyps or small growths are found.  People who are at an increased risk for hepatitis B should be screened for this virus. You are considered at high risk for hepatitis B if:  You were born in a country where hepatitis B occurs often. Talk with your health care provider about which countries are considered high risk.  Your parents were born in a high-risk country and you have not received a shot to protect against hepatitis B (hepatitis B vaccine).  You have HIV or AIDS.  You use needles to inject street drugs.  You live with, or have sex with, someone who has hepatitis B.  You get hemodialysis treatment.  You take certain medicines for conditions like cancer, organ transplantation, and autoimmune conditions.  Hepatitis C blood testing is recommended for all people born from [birth year] through 1965 and any individual with known risks for hepatitis C.  Practice safe sex. Use condoms and avoid high-risk sexual practices to reduce the spread of sexually transmitted infections (STIs). STIs include gonorrhea, chlamydia, syphilis, trichomonas, herpes, HPV, and human immunodeficiency virus (HIV). Herpes, HIV, and HPV are viral illnesses that have no cure. They can result in disability, cancer, and death.  You should be screened for sexually  transmitted illnesses (STIs) including gonorrhea and chlamydia if:  You are sexually active and are younger than 24 years.  You are older than 24 years and your health care provider tells you that you are at risk for this type of infection.  Your sexual activity has changed since you were last screened and you are at an increased risk for chlamydia or gonorrhea. Ask your health care provider if you are at risk.  If you are at risk of being infected with HIV, it is recommended that you take a prescription medicine daily to prevent HIV infection. This is called preexposure prophylaxis (PrEP). You are considered at risk if:  You are sexually active and do not regularly use condoms or know the HIV status of your partner(s).  You take drugs by injection.  You are sexually active with a partner who has HIV.  Talk with your health care provider about whether you are at high risk of being infected with HIV. If you choose to begin PrEP, you should first be tested for HIV. You should then be tested every 3 months for as long as you are taking PrEP.  Osteoporosis is a disease in which the bones lose minerals and strength with aging. This can result in serious bone fractures or breaks. The risk of osteoporosis can be identified using a bone density scan. Women ages 68 years and over and women at risk for fractures or osteoporosis should discuss screening with their health care providers. Ask your health care provider whether you should take a calcium supplement or vitamin D to reduce the rate of osteoporosis.  Menopause can be associated with physical symptoms and risks. Hormone replacement therapy is available to decrease symptoms and risks. You should talk to your health care provider about whether hormone replacement therapy is right for you.  Use sunscreen. Apply sunscreen liberally and repeatedly throughout the day. You should seek shade when your shadow is shorter than you. Protect yourself by  wearing long sleeves, pants, a wide-brimmed hat, and sunglasses year round, whenever you are outdoors.  Once a month, do a whole body skin exam, using a mirror to look at the skin on  your back. Tell your health care provider of new moles, moles that have irregular borders, moles that are larger than a pencil eraser, or moles that have changed in shape or color.  Stay current with required vaccines (immunizations).  Influenza vaccine. All adults should be immunized every year.  Tetanus, diphtheria, and acellular pertussis (Td, Tdap) vaccine. Pregnant women should receive 1 dose of Tdap vaccine during each pregnancy. The dose should be obtained regardless of the length of time since the last dose. Immunization is preferred during the 27th-36th week of gestation. An adult who has not previously received Tdap or who does not know her vaccine status should receive 1 dose of Tdap. This initial dose should be followed by tetanus and diphtheria toxoids (Td) booster doses every 10 years. Adults with an unknown or incomplete history of completing a 3-dose immunization series with Td-containing vaccines should begin or complete a primary immunization series including a Tdap dose. Adults should receive a Td booster every 10 years.  Varicella vaccine. An adult without evidence of immunity to varicella should receive 2 doses or a second dose if she has previously received 1 dose. Pregnant females who do not have evidence of immunity should receive the first dose after pregnancy. This first dose should be obtained before leaving the health care facility. The second dose should be obtained 4-8 weeks after the first dose.  Human papillomavirus (HPV) vaccine. Females aged 13-26 years who have not received the vaccine previously should obtain the 3-dose series. The vaccine is not recommended for use in pregnant females. However, pregnancy testing is not needed before receiving a dose. If a female is found to be pregnant  after receiving a dose, no treatment is needed. In that case, the remaining doses should be delayed until after the pregnancy. Immunization is recommended for any person with an immunocompromised condition through the age of 79 years if she did not get any or all doses earlier. During the 3-dose series, the second dose should be obtained 4-8 weeks after the first dose. The third dose should be obtained 24 weeks after the first dose and 16 weeks after the second dose.  Zoster vaccine. One dose is recommended for adults aged 58 years or older unless certain conditions are present.  Measles, mumps, and rubella (MMR) vaccine. Adults born before [birth year] generally are considered immune to measles and mumps. Adults born in [birth year] or later should have 1 or more doses of MMR vaccine unless there is a contraindication to the vaccine or there is laboratory evidence of immunity to each of the three diseases. A routine second dose of MMR vaccine should be obtained at least 28 days after the first dose for students attending postsecondary schools, health care workers, or international travelers. People who received inactivated measles vaccine or an unknown type of measles vaccine during 1963-1967 should receive 2 doses of MMR vaccine. People who received inactivated mumps vaccine or an unknown type of mumps vaccine before 1979 and are at high risk for mumps infection should consider immunization with 2 doses of MMR vaccine. For females of childbearing age, rubella immunity should be determined. If there is no evidence of immunity, females who are not pregnant should be vaccinated. If there is no evidence of immunity, females who are pregnant should delay immunization until after pregnancy. Unvaccinated health care workers born before [birth year] who lack laboratory evidence of measles, mumps, or rubella immunity or laboratory confirmation of disease should consider measles and mumps immunization with 2 doses of MMR  vaccine or rubella  immunization with 1 dose of MMR vaccine.  Pneumococcal 13-valent conjugate (PCV13) vaccine. When indicated, a person who is uncertain of his immunization history and has no record of immunization should receive the PCV13 vaccine. All adults 24 years of age and older should receive this vaccine. An adult aged 56 years or older who has certain medical conditions and has not been previously immunized should receive 1 dose of PCV13 vaccine. This PCV13 should be followed with a dose of pneumococcal polysaccharide (PPSV23) vaccine. Adults who are at high risk for pneumococcal disease should obtain the PPSV23 vaccine at least 8 weeks after the dose of PCV13 vaccine. Adults older than 25 years of age who have normal immune system function should obtain the PPSV23 vaccine dose at least 1 year after the dose of PCV13 vaccine.  Pneumococcal polysaccharide (PPSV23) vaccine. When PCV13 is also indicated, PCV13 should be obtained first. All adults aged 60 years and older should be immunized. An adult younger than age 71 years who has certain medical conditions should be immunized. Any person who resides in a nursing home or long-term care facility should be immunized. An adult smoker should be immunized. People with an immunocompromised condition and certain other conditions should receive both PCV13 and PPSV23 vaccines. People with human immunodeficiency virus (HIV) infection should be immunized as soon as possible after diagnosis. Immunization during chemotherapy or radiation therapy should be avoided. Routine use of PPSV23 vaccine is not recommended for American Indians, [place] Natives, or people younger than 65 years unless there are medical conditions that require PPSV23 vaccine. When indicated, people who have unknown immunization and have no record of immunization should receive PPSV23 vaccine. One-time revaccination 5 years after the first dose of PPSV23 is recommended for people aged 19-64 years who have chronic  kidney failure, nephrotic syndrome, asplenia, or immunocompromised conditions. People who received 1-2 doses of PPSV23 before age 78 years should receive another dose of PPSV23 vaccine at age 59 years or later if at least 5 years have passed since the previous dose. Doses of PPSV23 are not needed for people immunized with PPSV23 at or after age 51 years.  Meningococcal vaccine. Adults with asplenia or persistent complement component deficiencies should receive 2 doses of quadrivalent meningococcal conjugate (MenACWY-D) vaccine. The doses should be obtained at least 2 months apart. Microbiologists working with certain meningococcal bacteria, [place] recruits, people at risk during an outbreak, and people who travel to or live in countries with a high rate of meningitis should be immunized. A first-year college student up through age 75 years who is living in a residence hall should receive a dose if she did not receive a dose on or after her 16th birthday. Adults who have certain high-risk conditions should receive one or more doses of vaccine.  Hepatitis A vaccine. Adults who wish to be protected from this disease, have certain high-risk conditions, work with hepatitis A-infected animals, work in hepatitis A research labs, or travel to or work in countries with a high rate of hepatitis A should be immunized. Adults who were previously unvaccinated and who anticipate close contact with an international adoptee during the first 60 days after arrival in the [place] from a country with a high rate of hepatitis A should be immunized.  Hepatitis B vaccine. Adults who wish to be protected from this disease, have certain high-risk conditions, may be exposed to blood or other infectious body fluids, are household contacts or sex partners of hepatitis B positive  people, are clients or workers in certain care facilities, or travel to or work in countries with a high rate of hepatitis B should be  immunized.  Haemophilus influenzae type b (Hib) vaccine. A previously unvaccinated person with asplenia or sickle cell disease or having a scheduled splenectomy should receive 1 dose of Hib vaccine. Regardless of previous immunization, a recipient of a hematopoietic stem cell transplant should receive a 3-dose series 6-12 months after her successful transplant. Hib vaccine is not recommended for adults with HIV infection. Preventive Services / Frequency Ages 67 to 70 years  Blood pressure check.** / Every 3-5 years.  Lipid and cholesterol check.** / Every 5 years beginning at age 9.  Clinical breast exam.** / Every 3 years for women in their 72s and 70s.  BRCA-related cancer risk assessment.** / For women who have family members with a BRCA-related cancer (breast, ovarian, tubal, or peritoneal cancers).  Pap test.** / Every 2 years from ages 61 through 57. Every 3 years starting at age 43 through age 32 or 27 with a history of 3 consecutive normal Pap tests.  HPV screening.** / Every 3 years from ages 86 through ages 29 to 8 with a history of 3 consecutive normal Pap tests.  Hepatitis C blood test.** / For any individual with known risks for hepatitis C.  Skin self-exam. / Monthly.  Influenza vaccine. / Every year.  Tetanus, diphtheria, and acellular pertussis (Tdap, Td) vaccine.** / Consult your health care provider. Pregnant women should receive 1 dose of Tdap vaccine during each pregnancy. 1 dose of Td every 10 years.  Varicella vaccine.** / Consult your health care provider. Pregnant females who do not have evidence of immunity should receive the first dose after pregnancy.  HPV vaccine. / 3 doses over 6 months, if 11 and younger. The vaccine is not recommended for use in pregnant females. However, pregnancy testing is not needed before receiving a dose.  Measles, mumps, rubella (MMR) vaccine.** / You need at least 1 dose of MMR if you were born in 1957 or later. You may also need  a 2nd dose. For females of childbearing age, rubella immunity should be determined. If there is no evidence of immunity, females who are not pregnant should be vaccinated. If there is no evidence of immunity, females who are pregnant should delay immunization until after pregnancy.  Pneumococcal 13-valent conjugate (PCV13) vaccine.** / Consult your health care provider.  Pneumococcal polysaccharide (PPSV23) vaccine.** / 1 to 2 doses if you smoke cigarettes or if you have certain conditions.  Meningococcal vaccine.** / 1 dose if you are age 45 to 63 years and a Market researcher living in a residence hall, or have one of several medical conditions, you need to get vaccinated against meningococcal disease. You may also need additional booster doses.  Hepatitis A vaccine.** / Consult your health care provider.  Hepatitis B vaccine.** / Consult your health care provider.  Haemophilus influenzae type b (Hib) vaccine.** / Consult your health care provider. Ages 21 to 1 years  Blood pressure check.** / Every year.  Lipid and cholesterol check.** / Every 5 years beginning at age 34 years.  Lung cancer screening. / Every year if you are aged 29-80 years and have a 30-pack-year history of smoking and currently smoke or have quit within the past 15 years. Yearly screening is stopped once you have quit smoking for at least 15 years or develop a health problem that would prevent you from having lung cancer treatment.  Clinical breast exam.** / Every year after age 63 years.  BRCA-related cancer risk assessment.** / For women who have family members with a BRCA-related cancer (breast, ovarian, tubal, or peritoneal cancers).  Mammogram.** / Every year beginning at age 23 years and continuing for as long as you are in good health. Consult with your health care provider.  Pap test.** / Every 3 years starting at age 38 years through age 16 or 60 years with a history of 3 consecutive normal Pap  tests.  HPV screening.** / Every 3 years from ages 50 years through ages 73 to 21 years with a history of 3 consecutive normal Pap tests.  Fecal occult blood test (FOBT) of stool. / Every year beginning at age 4 years and continuing until age 34 years. You may not need to do this test if you get a colonoscopy every 10 years.  Flexible sigmoidoscopy or colonoscopy.** / Every 5 years for a flexible sigmoidoscopy or every 10 years for a colonoscopy beginning at age 24 years and continuing until age 66 years.  Hepatitis C blood test.** / For all people born from [birth year] through 1965 and any individual with known risks for hepatitis C.  Skin self-exam. / Monthly.  Influenza vaccine. / Every year.  Tetanus, diphtheria, and acellular pertussis (Tdap/Td) vaccine.** / Consult your health care provider. Pregnant women should receive 1 dose of Tdap vaccine during each pregnancy. 1 dose of Td every 10 years.  Varicella vaccine.** / Consult your health care provider. Pregnant females who do not have evidence of immunity should receive the first dose after pregnancy.  Zoster vaccine.** / 1 dose for adults aged 61 years or older.  Measles, mumps, rubella (MMR) vaccine.** / You need at least 1 dose of MMR if you were born in 1957 or later. You may also need a second dose. For females of childbearing age, rubella immunity should be determined. If there is no evidence of immunity, females who are not pregnant should be vaccinated. If there is no evidence of immunity, females who are pregnant should delay immunization until after pregnancy.  Pneumococcal 13-valent conjugate (PCV13) vaccine.** / Consult your health care provider.  Pneumococcal polysaccharide (PPSV23) vaccine.** / 1 to 2 doses if you smoke cigarettes or if you have certain conditions.  Meningococcal vaccine.** / Consult your health care provider.  Hepatitis A vaccine.** / Consult your health care provider.  Hepatitis B vaccine.** / Consult  your health care provider.  Haemophilus influenzae type b (Hib) vaccine.** / Consult your health care provider. Ages 60 years and over  Blood pressure check.** / Every year.  Lipid and cholesterol check.** / Every 5 years beginning at age 46 years.  Lung cancer screening. / Every year if you are aged 58-80 years and have a 30-pack-year history of smoking and currently smoke or have quit within the past 15 years. Yearly screening is stopped once you have quit smoking for at least 15 years or develop a health problem that would prevent you from having lung cancer treatment.  Clinical breast exam.** / Every year after age 41 years.  BRCA-related cancer risk assessment.** / For women who have family members with a BRCA-related cancer (breast, ovarian, tubal, or peritoneal cancers).  Mammogram.** / Every year beginning at age 96 years and continuing for as long as you are in good health. Consult with your health care provider.  Pap test.** / Every 3 years starting at age 22 years through age 70 or 59 years with 3 consecutive  normal Pap tests. Testing can be stopped between 65 and 70 years with 3 consecutive normal Pap tests and no abnormal Pap or HPV tests in the past 10 years.  HPV screening.** / Every 3 years from ages 81 years through ages 50 or 91 years with a history of 3 consecutive normal Pap tests. Testing can be stopped between 65 and 70 years with 3 consecutive normal Pap tests and no abnormal Pap or HPV tests in the past 10 years.  Fecal occult blood test (FOBT) of stool. / Every year beginning at age 36 years and continuing until age 85 years. You may not need to do this test if you get a colonoscopy every 10 years.  Flexible sigmoidoscopy or colonoscopy.** / Every 5 years for a flexible sigmoidoscopy or every 10 years for a colonoscopy beginning at age 41 years and continuing until age 91 years.  Hepatitis C blood test.** / For all people born from [birth year] through 1965 and any  individual with known risks for hepatitis C.  Osteoporosis screening.** / A one-time screening for women ages 3 years and over and women at risk for fractures or osteoporosis.  Skin self-exam. / Monthly.  Influenza vaccine. / Every year.  Tetanus, diphtheria, and acellular pertussis (Tdap/Td) vaccine.** / 1 dose of Td every 10 years.  Varicella vaccine.** / Consult your health care provider.  Zoster vaccine.** / 1 dose for adults aged 80 years or older.  Pneumococcal 13-valent conjugate (PCV13) vaccine.** / Consult your health care provider.  Pneumococcal polysaccharide (PPSV23) vaccine.** / 1 dose for all adults aged 49 years and older.  Meningococcal vaccine.** / Consult your health care provider.  Hepatitis A vaccine.** / Consult your health care provider.  Hepatitis B vaccine.** / Consult your health care provider.  Haemophilus influenzae type b (Hib) vaccine.** / Consult your health care provider. ** Family history and personal history of risk and conditions may change your health care provider's recommendations.   This information is not intended to replace advice given to you by your health care provider. Make sure you discuss any questions you have with your health care provider.   Document Released: 06/22/2001 Document Revised: 05/17/2014 Document Reviewed: 09/21/2010 Elsevier Interactive Patient Education [employer].

## 2015-07-04 LAB — T3, FREE: T3 FREE: 3.3 pg/mL (ref 2.3–4.2)

## 2015-07-04 LAB — T4, FREE: Free T4: 1.4 ng/dL (ref 0.8–1.8)

## 2015-07-04 LAB — HEPATITIS B SURFACE ANTIGEN: Hepatitis B Surface Ag: NEGATIVE

## 2015-07-04 LAB — HIV ANTIBODY (ROUTINE TESTING W REFLEX): HIV 1&2 Ab, 4th Generation: NONREACTIVE

## 2015-07-04 LAB — HEPATITIS C ANTIBODY: HCV Ab: NEGATIVE

## 2015-07-04 LAB — RPR

## 2015-07-07 ENCOUNTER — Encounter: Payer: Self-pay | Admitting: Family Medicine

## 2015-07-07 LAB — CYTOLOGY - PAP

## 2015-09-26 ENCOUNTER — Ambulatory Visit: Payer: Federal, State, Local not specified - PPO

## 2015-10-23 ENCOUNTER — Ambulatory Visit (INDEPENDENT_AMBULATORY_CARE_PROVIDER_SITE_OTHER): Payer: Federal, State, Local not specified - PPO | Admitting: *Deleted

## 2015-10-23 DIAGNOSIS — Z3042 Encounter for surveillance of injectable contraceptive: Secondary | ICD-10-CM

## 2015-10-23 DIAGNOSIS — Z3049 Encounter for surveillance of other contraceptives: Secondary | ICD-10-CM

## 2015-10-23 LAB — POCT URINE PREGNANCY: Preg Test, Ur: NEGATIVE

## 2015-10-23 MED ORDER — MEDROXYPROGESTERONE ACETATE 150 MG/ML IM SUSY
150.0000 mg | PREFILLED_SYRINGE | Freq: Once | INTRAMUSCULAR | Status: AC
  Administered 2015-10-23: 150 mg via INTRAMUSCULAR

## 2015-10-23 NOTE — Progress Notes (Signed)
   Patient presents for Depo Provera injection States feeling well Last injection received 07/01/2015 Patient is overdue for injection. Urine pregnancy negative Depo Provera given IM LUOQ. Patient tolerated well. Patient states it has been "Years" since last had unprotected sex Patient aware to use second method of birth control such as condoms and spermicide for next 7 days Next injection due Aug 31-January 22, 2016 Reminder card given Fredderick SeveranceUCATTE, Anntonette Madewell L, RN

## 2016-02-12 ENCOUNTER — Ambulatory Visit (INDEPENDENT_AMBULATORY_CARE_PROVIDER_SITE_OTHER): Payer: Federal, State, Local not specified - PPO | Admitting: *Deleted

## 2016-02-12 DIAGNOSIS — Z3040 Encounter for surveillance of contraceptives, unspecified: Secondary | ICD-10-CM | POA: Diagnosis not present

## 2016-02-12 DIAGNOSIS — Z3042 Encounter for surveillance of injectable contraceptive: Secondary | ICD-10-CM

## 2016-02-12 LAB — POCT URINE PREGNANCY: Preg Test, Ur: NEGATIVE

## 2016-02-12 MED ORDER — MEDROXYPROGESTERONE ACETATE 150 MG/ML IM SUSP
150.0000 mg | Freq: Once | INTRAMUSCULAR | Status: AC
  Administered 2016-02-12: 150 mg via INTRAMUSCULAR

## 2016-02-12 NOTE — Progress Notes (Signed)
   Pt late for Depo Provera injection.  Pregnancy test ordered; results negative. Pt tolerated Depo injection. Depo given right upper outer quadrant.  Next injection due April 29, 2016-Jan. 4, 2018.  Reminder card given. Clovis PuMartin, Tamika L, RN

## 2016-02-24 ENCOUNTER — Ambulatory Visit (INDEPENDENT_AMBULATORY_CARE_PROVIDER_SITE_OTHER): Payer: Federal, State, Local not specified - PPO | Admitting: Physician Assistant

## 2016-02-24 ENCOUNTER — Ambulatory Visit (INDEPENDENT_AMBULATORY_CARE_PROVIDER_SITE_OTHER): Payer: Federal, State, Local not specified - PPO

## 2016-02-24 VITALS — BP 118/76 | HR 75 | Temp 98.7°F | Resp 16 | Ht 64.0 in | Wt 126.0 lb

## 2016-02-24 DIAGNOSIS — R079 Chest pain, unspecified: Secondary | ICD-10-CM

## 2016-02-24 LAB — POCT CBC
Granulocyte percent: 59.2 %G (ref 37–80)
HEMATOCRIT: 37.8 % (ref 37.7–47.9)
HEMOGLOBIN: 13.5 g/dL (ref 12.2–16.2)
LYMPH, POC: 3.2 (ref 0.6–3.4)
MCH, POC: 32.7 pg — AB (ref 27–31.2)
MCHC: 35.6 g/dL — AB (ref 31.8–35.4)
MCV: 91.6 fL (ref 80–97)
MID (cbc): 0.7 (ref 0–0.9)
MPV: 9 fL (ref 0–99.8)
POC GRANULOCYTE: 5.6 (ref 2–6.9)
POC LYMPH %: 33.6 % (ref 10–50)
POC MID %: 7.2 % (ref 0–12)
Platelet Count, POC: 154 10*3/uL (ref 142–424)
RBC: 4.12 M/uL (ref 4.04–5.48)
RDW, POC: 13.7 %
WBC: 9.5 10*3/uL (ref 4.6–10.2)

## 2016-02-24 LAB — D-DIMER, QUANTITATIVE: D-Dimer, Quant: <0.19 mcg/mL FEU (ref <0.50)

## 2016-02-24 NOTE — Progress Notes (Signed)
02/24/2016 3:45 PM   DOB: 11-29-1990 / MRN: [id number]  SUBJECTIVE:  Robin Flynn is a well appearing 25 y.o. female presenting for substernal chest pain that she describes as sharp.  Denies cough, GERD, new physical activities.  She does associates some SOB and new DOE.  Also feels that her throat is getting sore.  She has never had this problem before.  No personal history of VTE and no family history.  Does take Depo Provera and does smoke black and mild daily.  Denies leg swelling and calf pain.  Feels that she is getting worse. No new medications. No recent trauma.   She has No Known Allergies.   She  has a past medical history of Allergy.    She  reports that she has been smoking.  She has never used smokeless tobacco. She reports that she drinks about 1.5 oz of alcohol per week . She reports that she uses drugs, including Marijuana. She  reports that she currently engages in sexual activity. The patient  has no past surgical history on file.  Her family history includes Cancer in her paternal grandmother.  Review of Systems  Constitutional: Negative for chills and fever.  Respiratory: Negative for cough.   Cardiovascular: Negative for chest pain, palpitations and leg swelling.  Gastrointestinal: Negative for nausea.  Genitourinary: Negative for dysuria.  Musculoskeletal: Negative for myalgias.  Skin: Negative for itching and rash.  Neurological: Negative for dizziness and headaches.    The problem list and medications were reviewed and updated by myself where necessary and exist elsewhere in the encounter.   OBJECTIVE:  BP 118/76 (BP Location: Right Arm, Patient Position: Sitting, Cuff Size: Normal)   Pulse 75   Temp 98.7 F (37.1 C) (Oral)   Resp 16   Ht 5\' 4"  (1.626 m)   Wt 126 lb (57.2 kg)   SpO2 99%   BMI 21.63 kg/m   Physical Exam  Constitutional: She is oriented to person, place, and time.  Cardiovascular: Normal rate, regular rhythm and normal heart sounds.    No murmur heard. Pulmonary/Chest: Effort normal and breath sounds normal. She has no wheezes. She has no rales.    Musculoskeletal: Normal range of motion.  Neurological: She is alert and oriented to person, place, and time.  Skin: Skin is warm and dry.  Psychiatric: She has a normal mood and affect.   EKG: NSR, - for S1Q3T3, normal axis, -hypertrophy, signs of ischemia and infarction.    Results for orders placed or performed in visit on 02/24/16 (from the past 72 hour(s))  POCT CBC     Status: Abnormal   Collection Time: 02/24/16  3:15 PM  Result Value Ref Range   WBC 9.5 4.6 - 10.2 K/uL   Lymph, poc 3.2 0.6 - 3.4   POC LYMPH PERCENT 33.6 10 - 50 %L   MID (cbc) 0.7 0 - 0.9   POC MID % 7.2 0 - 12 %M   POC Granulocyte 5.6 2 - 6.9   Granulocyte percent 59.2 37 - 80 %G   RBC 4.12 4.04 - 5.48 M/uL   Hemoglobin 13.5 12.2 - 16.2 g/dL   HCT, POC 40.9 81.1 - 47.9 %   MCV 91.6 80 - 97 fL   MCH, POC 32.7 (A) 27 - 31.2 pg   MCHC 35.6 (A) 31.8 - 35.4 g/dL   RDW, POC 91.4 %   Platelet Count, POC 154 142 - 424 K/uL   MPV 9.0 0 - 99.8  fL    Dg Chest 2 View  Result Date: 02/24/2016 CLINICAL DATA:  Substernal chest pain EXAM: CHEST  2 VIEW COMPARISON:  None. FINDINGS: Cardiac shadow is within normal limits. The lungs are well aerated bilaterally. Mild increased density is noted superimpose over the anterior aspect of the left third rib. This may represent some rib calcification as it is not well seen on the lateral projection. No other focal abnormality is noted IMPRESSION: Density overlying the left midlung as described likely related rib calcification. No other focal abnormality is noted. Short-term follow-up in 3-6 months may be helpful to assess for stability. Electronically Signed   By: Alcide CleverMark  Lukens M.D.   On: 02/24/2016 15:17    ASSESSMENT AND PLAN  Fleet ContrasRachel was seen today for chest pain.  Diagnoses and all orders for this visit:  Chest pain, unspecified type: Her chest rad is  reassuring in that it appears we have found a source for her pain.  I do find it odd however that she has a rib calcification in the absence of any trauma.  Given her symptoms I am going to run a d-dimer because she does have risk factors. Will call her if positive at [phone] and she understands she will need to go the ED for eval and management.  -     EKG 12-Lead -     DG Chest 2 View; Future -     POCT CBC -     [facility]dimer, [facility])    The patient is advised to call or return to clinic if she does not see an improvement in symptoms, or to seek the care of the closest emergency department if she worsens with the above plan.   Deliah BostonMichael Clark, MHS, PA-C [facility] 02/24/2016 3:45 PM

## 2016-02-24 NOTE — Patient Instructions (Addendum)
Take 600 mg of ibuprofen every 8 hours for pain.     IF you received an x-ray today, you will receive an invoice from [facility]. Please contact [facility] at [phone] with questions or concerns regarding your invoice.   IF you received labwork today, you will receive an invoice from [facility]. Please contact [facility] at [phone] with questions or concerns regarding your invoice.   Our billing staff will not be able to assist you with questions regarding bills from these companies.  You will be contacted with the lab results as soon as they are available. The fastest way to get your results is to activate your My Chart account. Instructions are located on the last page of this paperwork. If you have not heard from us regarding the results in 2 weeks, please contact this office.     

## 2016-02-25 LAB — SEDIMENTATION RATE: Sed Rate: 4 mm/hr (ref 0–20)

## 2016-02-25 NOTE — Progress Notes (Signed)
Please send a letter.  I would like to see her back in one month if she continues having symptoms. Deliah BostonMichael Latifa Noble, MS, PA-C 5:05 PM, 02/25/2016

## 2016-03-02 ENCOUNTER — Ambulatory Visit (INDEPENDENT_AMBULATORY_CARE_PROVIDER_SITE_OTHER): Payer: Federal, State, Local not specified - PPO | Admitting: Student

## 2016-03-02 ENCOUNTER — Encounter: Payer: Self-pay | Admitting: Student

## 2016-03-02 VITALS — BP 108/57 | HR 56 | Temp 98.2°F | Ht 64.0 in | Wt 128.6 lb

## 2016-03-02 DIAGNOSIS — K219 Gastro-esophageal reflux disease without esophagitis: Secondary | ICD-10-CM

## 2016-03-02 DIAGNOSIS — R079 Chest pain, unspecified: Secondary | ICD-10-CM | POA: Diagnosis not present

## 2016-03-02 MED ORDER — PANTOPRAZOLE SODIUM 40 MG PO TBEC: 40.0000 mg | DELAYED_RELEASE_TABLET | Freq: Every day | ORAL | 0 refills | Status: AC

## 2016-03-02 NOTE — Progress Notes (Signed)
   Subjective:    Patient ID: Robin Flynn is a 25 y.o. old female.  HPI #Chest pain for two weeks. Worse about a week ago and went to urgent care and was given Advil that didn't do anything. No provocation.  She was sleeping when she felt it for the first time. She woke up choking, throat burning and difficulty breathing about 3-4 times a week. She reports coughing after that. This lasts about 15 minutes. Drinking water helped. This has gotten worse recently. Report history of heartburn if she eats something hot. She states her chest pain is consistent. She describes chest pain as somebody stepping on her chest in the middle. Radiation to her back between her shoulder blade.  [place] [employer] [place] She is also in [employer]. Denies trauma.   PMH: reviewed  FMH: father passed away at age of 25 years from heart attack.   SH: Smokes 2-3 cigarettes a day. Drinks [employer]ionly.  Denies Recreational drug use  Review of Systems Per HPI Objective:   Vitals:   03/02/16 1517  BP: (!) 108/57  Pulse: (!) 56  Temp: 98.2 F (36.8 C)  TempSrc: Oral  SpO2: 100%  Weight: 128 lb 9.6 oz (58.3 kg)  Height: 5\' 4"  (1.626 m)    GEN: appears well, no apparent distress. Nares: no rhinorrhea, congestion or erythema,  Oropharynx: mmm without erythema or exudation HEM: Negative for cervical lymphadenopathy CVS: RRR, normal s1 and s2, no murmurs, no edema, no friction rubs RESP: no increased work of breathing, good air movement bilaterally, no crackles or wheeze GI: Bowel sounds present and normal, soft, non-tender,non-distended MSK: Tender to palpation over her upper mid-sternum. Similar to her chest pain.  SKIN: No apparent skin lesion NEURO: alert and oriented appropriately, no gross defecits  PSYCH: appropriate mood and affect     Assessment & Plan:  Chest pain at rest Likely due to GERD. She reports waking up choking, throat burning and difficulty breathing. This improves with water. She is also  tender to palpation over upper third of his sternum which suggested some musculoskeletal component. She had EKG at urgent care about a week ago which was unremarkable. D-dimer was also negative. CXR significant for density overlying the left midlung likely related rib calcification otherwise normal. Recommended  follow-up in 3-6 months.  -Gave prescription for Protonix 40 mg daily for 6 weeks -Discussed about dietary changes and gave handout on GERD. -I also recommended using Tylenol instead of NSAID for chest pain  -Follow up as needed. Otherwise will have a follow-up CXR in 6 months to reassess the density noted on a chest x-ray from 02/24/2016.

## 2016-03-02 NOTE — Patient Instructions (Addendum)
It was great seeing you today! Your symptoms are likely due to acid reflux (GERD). I have sent a prescription for Protonix to the pharmacy. Take this medication daily for the next 6 weeks. I also recommend taking Tylenol instead of Advil for pain. Advil can irritate your stomach. Please come back and see Robin Flynn if you have worsening of symptoms, worsening chest pain, shortness of breath, fever or other symptoms concerning to you.    If we did any lab work today, and the results require attention, either me or my nurse will get in touch with you. If everything is normal, you will get a letter in mail. If you don't hear from Robin Flynn in two weeks, please give Robin Flynn a call. Otherwise, we look forward to seeing you again at your next visit. If you have any questions or concerns before then, please call the clinic at [phone].   Please bring all your medications to every doctors visit   Sign up for My Chart to have easy access to your labs results, and communication with your Primary care physician.     Please check-out at the front desk before leaving the clinic.    Take Care,    Gastroesophageal Reflux Disease, Adult Normally, food travels down the esophagus and stays in the stomach to be digested. If a person has gastroesophageal reflux disease (GERD), food and stomach acid move back up into the esophagus. When this happens, the esophagus becomes sore and swollen (inflamed). Over time, GERD can make small holes (ulcers) in the lining of the esophagus. HOME CARE Diet  Follow a diet as told by your doctor. You may need to avoid foods and drinks such as:  Coffee and tea (with or without caffeine).  Drinks that contain alcohol.  Energy drinks and sports drinks.  Carbonated drinks or sodas.  Chocolate and cocoa.  Peppermint and mint flavorings.  Garlic and onions.  Horseradish.  Spicy and acidic foods, such as peppers, chili powder, curry powder, vinegar, hot sauces, and BBQ  sauce.  Citrus fruit juices and citrus fruits, such as oranges, lemons, and limes.  Tomato-based foods, such as red sauce, chili, salsa, and pizza with red sauce.  Fried and fatty foods, such as donuts, french fries, potato chips, and high-fat dressings.  High-fat meats, such as hot dogs, rib eye steak, sausage, ham, and bacon.  High-fat dairy items, such as whole milk, butter, and cream cheese.  Eat small meals often. Avoid eating large meals.  Avoid drinking large amounts of liquid with your meals.  Avoid eating meals during the 2-3 hours before bedtime.  Avoid lying down right after you eat.  Do not exercise right after you eat. General Instructions  Pay attention to any changes in your symptoms.  Take over-the-counter and prescription medicines only as told by your doctor. Do not take aspirin, ibuprofen, or other NSAIDs unless your doctor says it is okay.  Do not use any tobacco products, including cigarettes, chewing tobacco, and e-cigarettes. If you need help quitting, ask your doctor.  Wear loose clothes. Do not wear anything tight around your waist.  Raise (elevate) the head of your bed about 6 inches (15 cm).  Try to lower your stress. If you need help doing this, ask your doctor.  If you are overweight, lose an amount of weight that is healthy for you. Ask your doctor about a safe weight loss goal.  Keep all follow-up visits as told by your doctor. This is important. GET HELP IF:  You have new symptoms.  You lose weight and you do not know why it is happening.  You have trouble swallowing, or it hurts to swallow.  You have wheezing or a cough that keeps happening.  Your symptoms do not get better with treatment.  You have a hoarse voice. GET HELP RIGHT AWAY IF:  You have pain in your arms, neck, jaw, teeth, or back.  You feel sweaty, dizzy, or light-headed.  You have chest pain or shortness of breath.  You throw up (vomit) and your throw up looks  like blood or coffee grounds.  You pass out (faint).  Your poop (stool) is bloody or black.  You cannot swallow, drink, or eat.   This information is not intended to replace advice given to you by your health care provider. Make sure you discuss any questions you have with your health care provider.   Document Released: 10/13/2007 Document Revised: 01/15/2015 Document Reviewed: 08/21/2014 Elsevier Interactive Patient Education [employer] Elsevier Inc.

## 2016-03-04 DIAGNOSIS — R079 Chest pain, unspecified: Secondary | ICD-10-CM | POA: Insufficient documentation

## 2016-03-04 NOTE — Assessment & Plan Note (Signed)
Likely due to GERD. She reports waking up choking, throat burning and difficulty breathing. This improves with water. She is also tender to palpation over upper third of his sternum which suggested some musculoskeletal component. She had EKG at urgent care about a week ago which was unremarkable. D-dimer was also negative. CXR significant for density overlying the left midlung likely related rib calcification otherwise normal. Recommended  follow-up in 3-6 months.  -Gave prescription for Protonix 40 mg daily for 6 weeks -Discussed about dietary changes and gave handout on GERD. -I also recommended using Tylenol instead of NSAID for chest pain  -Follow up as needed. Otherwise will have a follow-up CXR in 6 months to reassess the density noted on a chest x-ray from 02/24/2016.

## 2017-02-04 ENCOUNTER — Encounter: Payer: Federal, State, Local not specified - PPO | Admitting: Family Medicine

## 2017-02-04 NOTE — Progress Notes (Deleted)
   Subjective:  Robin Flynn is a 26 y.o. year old female who presents to office today for an annual physical examination.  Concerns today include:  1. ***   Review of Systems {ros; complete:30496}   Review of Systems: Per HPI. All other systems reviewed and are negative.  General Healthcare: Medication Compliance: *** Dx Hypertension: *** Dx Hyperlipidemia: *** Diabetes: *** Dx Obesity: *** Weight Loss: *** Physical Activity: *** Urinary Incontinence: ***  Menstrual hx: *** Last dental exam: ***  Social:  reports that she has been smoking.  She has never used smokeless tobacco. Driving: *** Alcohol Use: *** Tobacco ***  Other Drugs: ***  Support and Life at Home: *** Advanced Directives: *** Work: ***  Cancer:  Colorectal >> Colonoscopy: *** Lung >> Tobacco Use: ***              - If so, previous Low-Dose CT screen: *** Breast >> Mammogram: *** Cervical/Endometrial >>  - Postmenopausal: *** - Hysterectomy: *** - Vaginal Bleeding: *** Skin >> Suspicious lesions: ***  Other: Osteoporosis: *** TDAP: *** every 53yrs - (<3 lifetime doses or unknown): all wounds -- look up need for Tetanus IG - (>=3 lifetime doses): clean/minor wound if >79yrs from previous; all other wounds if >53yrs from previous Zoster Vaccine: *** (those >50yo, once) Pneumonia Vaccine: *** (those w/ risk factors) - (<68yr) Both: Immunocompromised, cochlear implant, CSF leak, asplenic, sickle cell, Chronic Renal Failure - (<106yr) PPSV-23 only: Heart dz, lung disease, DM, tobacco abuse, alcoholism, cirrhosis/liver disease. - (>75yr): PPSV13 then PPSV23 in 6-12mths;  - (>18yr): repeat PPSV23 once if pt received prior to 26yo and 67yrs have passed   Health Maintenance Due  Topic Date Due  . INFLUENZA VACCINE  12/08/2016    Past Medical History Past Medical History:  Diagnosis Date  . Allergy    Patient Active Problem List   Diagnosis Date Noted  . Chest pain at rest 03/04/2016  .  On Depo-Provera 09/14/2013    Medications- reviewed and updated Current Outpatient Prescriptions  Medication Sig Dispense Refill  . medroxyPROGESTERone (DEPO-PROVERA) 150 MG/ML injection Inject 1 mL (150 mg total) into the muscle every 3 (three) months. 1 mL 11  . pantoprazole (PROTONIX) 40 MG tablet Take 1 tablet (40 mg total) by mouth daily. 90 tablet 0   No current facility-administered medications for this visit.     Objective: There were no vitals taken for this visit. Gen: In no acute distress, alert, cooperative with exam, well groomed HEENT: NCAT, EOMI, PERRL CV: Regular rate and rhythm, normal S1/S2, no murmur Resp: Clear to auscultation bilaterally, no wheezes, non-labored Abd: Soft, Non Tender, Non Distended, bowel sounds present, no guarding or organomegaly Ext: No edema, warm and well perfused Neuro: Alert and oriented, No gross deficits, normal gait Psych: Normal mood and affect   Assessment/Plan:  No problem-specific Assessment & Plan notes found for this encounter.   No orders of the defined types were placed in this encounter.   No orders of the defined types were placed in this encounter.    Anders Simmonds, MD [facility], PGY-3

## 2017-02-16 ENCOUNTER — Encounter: Payer: Federal, State, Local not specified - PPO | Admitting: Internal Medicine

## 2017-02-24 ENCOUNTER — Encounter: Payer: Federal, State, Local not specified - PPO | Admitting: Internal Medicine

## 2018-06-09 ENCOUNTER — Ambulatory Visit: Payer: Federal, State, Local not specified - PPO | Admitting: Family Medicine

## 2018-10-31 ENCOUNTER — Encounter (HOSPITAL_COMMUNITY): Payer: Self-pay

## 2018-10-31 ENCOUNTER — Emergency Department (HOSPITAL_COMMUNITY): Payer: BC Managed Care – PPO

## 2018-10-31 ENCOUNTER — Emergency Department (HOSPITAL_COMMUNITY)
Admission: EM | Admit: 2018-10-31 | Discharge: 2018-10-31 | Disposition: A | Discharge status: Home / Self Care | Payer: BC Managed Care – PPO | Attending: Emergency Medicine | Admitting: Emergency Medicine

## 2018-10-31 ENCOUNTER — Other Ambulatory Visit: Payer: Self-pay

## 2018-10-31 DIAGNOSIS — N83209 Unspecified ovarian cyst, unspecified side: Secondary | ICD-10-CM

## 2018-10-31 DIAGNOSIS — R112 Nausea with vomiting, unspecified: Secondary | ICD-10-CM | POA: Insufficient documentation

## 2018-10-31 DIAGNOSIS — R109 Unspecified abdominal pain: Secondary | ICD-10-CM | POA: Insufficient documentation

## 2018-10-31 DIAGNOSIS — R079 Chest pain, unspecified: Secondary | ICD-10-CM | POA: Diagnosis not present

## 2018-10-31 DIAGNOSIS — R1030 Lower abdominal pain, unspecified: Secondary | ICD-10-CM

## 2018-10-31 LAB — COMPREHENSIVE METABOLIC PANEL
ALT: 16 U/L (ref 0–44)
AST: 26 U/L (ref 15–41)
Albumin: 4.4 g/dL (ref 3.5–5.0)
Alkaline Phosphatase: 51 U/L (ref 38–126)
Anion gap: 11 (ref 5–15)
BUN: 9 mg/dL (ref 6–20)
CO2: 24 mmol/L (ref 22–32)
Calcium: 9.2 mg/dL (ref 8.9–10.3)
Chloride: 105 mmol/L (ref 98–111)
Creatinine, Ser: 0.61 mg/dL (ref 0.44–1.00)
GFR calc Af Amer: >60 mL/min (ref >60)
GFR calc non Af Amer: >60 mL/min (ref >60)
Glucose, Bld: 99 mg/dL (ref 70–99)
Potassium: 3.4 mmol/L — ABNORMAL LOW (ref 3.5–5.1)
Sodium: 140 mmol/L (ref 135–145)
Total Bilirubin: 0.3 mg/dL (ref 0.3–1.2)
Total Protein: 7.7 g/dL (ref 6.5–8.1)

## 2018-10-31 LAB — URINALYSIS, ROUTINE W REFLEX MICROSCOPIC

## 2018-10-31 LAB — URINALYSIS, MICROSCOPIC (REFLEX)
Bacteria, UA: NONE SEEN
RBC / HPF: >50 RBC/hpf (ref 0–5)

## 2018-10-31 LAB — CBC
HCT: 36.2 % (ref 36.0–46.0)
Hemoglobin: 12.1 g/dL (ref 12.0–15.0)
MCH: 31.5 pg (ref 26.0–34.0)
MCHC: 33.4 g/dL (ref 30.0–36.0)
MCV: 94.3 fL (ref 80.0–100.0)
Platelets: 166 10*3/uL (ref 150–400)
RBC: 3.84 MIL/uL — ABNORMAL LOW (ref 3.87–5.11)
RDW: 15.1 % (ref 11.5–15.5)
WBC: 11.2 10*3/uL — ABNORMAL HIGH (ref 4.0–10.5)
nRBC: 0 % (ref 0.0–0.2)

## 2018-10-31 LAB — LIPASE, BLOOD: Lipase: 30 U/L (ref 11–51)

## 2018-10-31 LAB — I-STAT BETA HCG BLOOD, ED (MC, WL, AP ONLY): I-stat hCG, quantitative: <5 m[IU]/mL (ref <5)

## 2018-10-31 MED ORDER — IOHEXOL 300 MG/ML  SOLN
100.0000 mL | Freq: Once | INTRAMUSCULAR | Status: AC | PRN
  Administered 2018-10-31: 100 mL via INTRAVENOUS

## 2018-10-31 MED ORDER — SODIUM CHLORIDE (PF) 0.9 % IJ SOLN
INTRAMUSCULAR | Status: AC
  Filled 2018-10-31: qty 50

## 2018-10-31 MED ORDER — ONDANSETRON HCL 4 MG/2ML IJ SOLN
4.0000 mg | Freq: Once | INTRAMUSCULAR | Status: AC
  Administered 2018-10-31: 4 mg via INTRAVENOUS
  Filled 2018-10-31: qty 2

## 2018-10-31 MED ORDER — SODIUM CHLORIDE 0.9 % IV BOLUS
1000.0000 mL | Freq: Once | INTRAVENOUS | Status: AC
  Administered 2018-10-31: 1000 mL via INTRAVENOUS

## 2018-10-31 MED ORDER — KETOROLAC TROMETHAMINE 15 MG/ML IJ SOLN
15.0000 mg | Freq: Once | INTRAMUSCULAR | Status: AC
  Administered 2018-10-31: 15 mg via INTRAVENOUS
  Filled 2018-10-31: qty 1

## 2018-10-31 NOTE — ED Triage Notes (Addendum)
Pt BIBA from home. Pt is stating that she is on her menstrual cycle and is in pain. Pt states the amount of blood is about the same as normal. Pt states that her cramps are "worse" and she had 3 episodes of emesis. No other complaints.  Pt states she took midol, but threw it up.

## 2018-10-31 NOTE — ED Notes (Signed)
Patient transported to CT 

## 2018-10-31 NOTE — ED Notes (Signed)
Pt aware of need for urine specimen. Pt will try to provide one after receiving fluids.

## 2018-10-31 NOTE — ED Provider Notes (Signed)
[facility] Emergency Department Provider Note MRN:  [id number]  Arrival date & time: 10/31/18     Chief Complaint   Abdominal pain History of Present Illness   Robin Flynn is a 28 y.o. year-old female with no pertinent past medical history presenting to the ED with chief complaint of abdominal pain.  Yesterday patient had poor appetite.  Patient woke up this morning with lower abdominal pain, nausea, single episode of nonbloody nonbilious emesis.  Pain is worst in the right lower quadrant, pain is constant, moderate in severity, no exacerbating or alleviating factors.  Patient also began her period today, normal amount of bleeding.  Endorsing burning chest pain after vomiting, denies shortness of breath, no upper abdominal pain.  Review of Systems  A complete 10 system review of systems was obtained and all systems are negative except as noted in the HPI and PMH.   Patient's Health History    Past Medical History:  Diagnosis Date  . Allergy     History reviewed. No pertinent surgical history.  Family History  Problem Relation Age of Onset  . Cancer Paternal Grandmother        lung    Social History   Socioeconomic History  . Marital status: Significant Other    Spouse name: Not on file  . Number of children: Not on file  . Years of education: Not on file  . Highest education level: Not on file  Occupational History  . Not on file  Social Needs  . Financial resource strain: Not on file  . Food insecurity    Worry: Not on file    Inability: Not on file  . Transportation needs    Medical: Not on file    Non-medical: Not on file  Tobacco Use  . Smoking status: Current Every Day Smoker  . Smokeless tobacco: Never Used  Substance and Sexual Activity  . Alcohol use: Yes    Alcohol/week: 3.0 standard drinks    Types: 3 Standard drinks or equivalent per week  . Drug use: Yes    Types: Marijuana  . Sexual activity: Yes  Lifestyle  . Physical  activity    Days per week: Not on file    Minutes per session: Not on file  . Stress: Not on file  Relationships  . Social Herbalist on phone: Not on file    Gets together: Not on file    Attends religious service: Not on file    Active member of club or organization: Not on file    Attends meetings of clubs or organizations: Not on file    Relationship status: Not on file  . Intimate partner violence    Fear of current or ex partner: Not on file    Emotionally abused: Not on file    Physically abused: Not on file    Forced sexual activity: Not on file  Other Topics Concern  . Not on file  Social History Narrative  . Not on file     Physical Exam  Vital Signs and Nursing Notes reviewed Vitals:   10/31/18 1044 10/31/18 1208  BP:  113/71  Pulse:  (!) 48  Resp:  11  Temp:    SpO2: 100% 100%    CONSTITUTIONAL: Well-appearing, NAD NEURO:  Alert and oriented x 3, no focal deficits EYES:  eyes equal and reactive ENT/NECK:  no LAD, no JVD CARDIO: Regular rate, well-perfused, normal S1 and S2 PULM:  CTAB no  wheezing or rhonchi GI/GU:  normal bowel sounds, non-distended, right lower quadrant moderate tenderness to palpation MSK/SPINE:  No gross deformities, no edema SKIN:  no rash, atraumatic PSYCH:  Appropriate speech and behavior  Diagnostic and Interventional Summary    EKG Interpretation  Date/Time:  Tuesday October 31 2018 12:07:55 EDT Ventricular Rate:  46 PR Interval:    QRS Duration: 74 QT Interval:  469 QTC Calculation: 411 R Axis:   71 Text Interpretation:  Sinus bradycardia Low voltage, precordial leads Confirmed by Kennis CarinaBero, Michael [id number]) on 10/31/2018 1:17:45 PM      Labs Reviewed  CBC - Abnormal; Notable for the following components:      Result Value   WBC 11.2 (*)    RBC 3.84 (*)    All other components within normal limits  COMPREHENSIVE METABOLIC PANEL - Abnormal; Notable for the following components:   Potassium 3.4 (*)    All other  components within normal limits  LIPASE, BLOOD  URINALYSIS, ROUTINE W REFLEX MICROSCOPIC  I-STAT BETA HCG BLOOD, ED ([facility], [facility], [facility] ONLY)    CT ABDOMEN PELVIS W CONTRAST    (Results Pending)    Medications  sodium chloride (PF) 0.9 % injection (has no administration in time range)  sodium chloride 0.9 % bolus 1,000 mL (1,000 mLs Intravenous New Bag/Given (Non-Interop) 10/31/18 1157)  ondansetron (ZOFRAN) injection 4 mg (4 mg Intravenous Given 10/31/18 1158)  ketorolac (TORADOL) 15 MG/ML injection 15 mg (15 mg Intravenous Given 10/31/18 1158)  iohexol (OMNIPAQUE) 300 MG/ML solution 100 mL (100 mLs Intravenous Contrast Given 10/31/18 1415)     Procedures Critical Care  ED Course and Medical Decision Making  I have reviewed the triage vital signs and the nursing notes.  Pertinent labs & imaging results that were available during my care of the patient were reviewed by me and considered in my medical decision making (see below for details).  CT to exclude appendicitis, also considering menstrual pain, felt to be less likely ovarian etiology.  Work-up pending.  Still awaiting CT and urinalysis.  Pelvic exam offered and deferred by patient, who is in a monogamous relationship with a female, has no concern for STDs, is not having any vaginal discharge or vaginal pain.  Advised GYN follow-up.  Anticipating discharge if CT nonacute.  Signed out to oncoming provider at shift change.  Elmer SowMichael M. Pilar PlateBero, MD [facility] [email or web address]  Final Clinical Impressions(s) / ED Diagnoses     ICD-10-CM   1. Lower abdominal pain  R10.30   2. Chest pain  R07.9 CANCELED: DG ABD ACUTE 2+V W 1V CHEST    CANCELED: DG ABD ACUTE 2+V W 1V CHEST    ED Discharge Orders    None         Sabas SousBero, Michael M, MD 10/31/18 1438

## 2018-10-31 NOTE — ED Provider Notes (Signed)
  Physical Exam  BP (!) 100/51   Pulse (!) 48   Temp 98.8 F (37.1 C) (Oral)   Resp 10   Ht 5\' 7"  (1.702 m)   Wt 53.5 kg   LMP 10/31/2018   SpO2 100%   BMI 18.48 kg/m   Physical Exam  ED Course/Procedures     Procedures  MDM  Received care of pt at 3PM from Dr. Sedonia Small. Please see his note for prior hx and physical. Briefly this is a 28yr old female who presents with abdominal pain. Declined pelvic. CT pending.  CT without appendicitis, with fluid suspicious for ruptured ovarian cyst.  Recommend continued supportive care, tylenol, ibuprofen. Patient discharged in stable condition with understanding of reasons to return.      Gareth Morgan, MD 11/01/18 2157

## 2018-11-08 ENCOUNTER — Other Ambulatory Visit: Payer: Self-pay

## 2018-11-08 ENCOUNTER — Encounter: Payer: Self-pay | Admitting: Family Medicine

## 2018-11-08 ENCOUNTER — Ambulatory Visit: Payer: BC Managed Care – PPO | Admitting: Family Medicine

## 2018-11-08 VITALS — BP 100/48 | HR 60 | Temp 97.8°F

## 2018-11-08 DIAGNOSIS — R1013 Epigastric pain: Secondary | ICD-10-CM

## 2018-11-08 DIAGNOSIS — R109 Unspecified abdominal pain: Secondary | ICD-10-CM | POA: Insufficient documentation

## 2018-11-08 DIAGNOSIS — R1010 Upper abdominal pain, unspecified: Secondary | ICD-10-CM

## 2018-11-08 MED ORDER — FAMOTIDINE 20 MG PO TABS: 20.0000 mg | ORAL_TABLET | Freq: Two times a day (BID) | ORAL | 3 refills | Status: AC

## 2018-11-08 NOTE — Progress Notes (Signed)
[facility] Phone: [phone]   cc: Abdominal pain  Subjective:  Abdominal pain since April the patient has been having abdominal pain.  She says it is mostly right upper quadrant, but that it spreads to the epigastric and left upper quadrant as well.  Last week her pain became acutely worse and she went to the emergency department.  At the emergency part she had a CT scan, which revealed no appendicitis, no cholecystitis, no pancreatitis, no abnormal GI findings, but did show possible ruptured ovarian cyst.  Patient's pain has not gotten any better since then.  She was taking Tylenol and Advil, but stopped because this medicine was not helping her.  She describes the pain as "waves" of right upper quadrant pain that have been 3-4 times a day.  She rates he is a 7-8/10 on the pain scale.  This pain then travels to her chest just below the sternum and then radiates down the left side.  She feels nauseous when she tries eat anything during this time.  She has not had vomiting since day she went to the emergency department.  It was nonbloody nonbilious.  She has had normal bowel movements during this time, no dysuria, no other symptoms other than the abdominal pain and nausea.  She drinks 2-4 drinks may be twice a month.  She drinks bottled water.  She does not know if her house has city or well water.  She smokes marijuana about twice a week so the past few years.  Random times throughout the day.  3-4 times a day.  Lasts for an hour.  7-8/10.  After the pain she can't eat for a while and it travels to her chest.  This lasts all day.  Has plenty of food.  Able to drink water.  Feels nauseous when she eats. No vomiting.  Last time she vomited was when she went ot he ER.  Not taking depot.  Not taking protonix.  Taking tyelnol and advil, but stopped bc it wasn't helping. Normal bm.  No dysuria.   ROS: See HPI for pertinent positives and negatives  Past Medical History  Family  history reviewed for today's visit. No changes.  Social history- patient is a non-smoker  Objective: BP (!) 100/48   Pulse 60   Temp 97.8 F (36.6 C) (Axillary)   LMP 10/31/2018 Comment: neg preg test 10/31/2018  SpO2 99%  Gen: NAD, alert and oriented, cooperative with exam CV: normal rate, regular rhythm. No murmurs, no rubs.  Resp: LCTAB, no wheezes, crackles. normal work of breathing GI: Mild tender to palpation in the right upper quadrant.  Moderate tenderness to palpation in the left upper quadrant.  Normal bowel sounds. Msk: No edema, warm, normal tone, moves UE/LE spontaneously Neuro: CN II-XII grossly intact. no gross deficits Skin: No rashes, no lesions Psych: Appropriate behavior  Assessment/Plan: Abdominal pain At least 2 months of abdominal pain that is diffusely upper quadrant.  Difficult to isolate a specific cause based on history and physical alone.  Recent CT has ruled out pancreatitis, appendicitis, cholecystitis.  Patient could still have cholelithiasis.  Likely to be GI related over GU or renal based on recent ED work-up.  CT scan did indicate possible hepatic steatosis.  Patient does not consume excessive amounts of alcohol, and is not obese. - RUQ ultrasound - Stool antigen test H. pylori - Begin Pepcid after stool antigen test - CMP - Hep C test -Follow-up in 1 month.  If  patient pain has not resolved or causes not been identified, refer to gastroenterologist.    Frederic Jerichoan Teressa Mcglocklin, MD PGY-1

## 2018-11-08 NOTE — Patient Instructions (Addendum)
I do not think your pain is caused by an ovarian cyst.  Is most likely caused from some sort of gastrointestinal cause.  CT scan is not the best option to find gallstones, so I ordered a right upper quadrant ultrasound, which he can get at the hospital.  I also ordered a stool antigen test which you will have to complete at home if you cannot provide a sample here.  I have also given you a prescription for an acid blocker for your stomach.  Please do not take this medicine before giving your stool sample.  I also included 2 blood tests to help determine if there is any issues with your liver.  I will let you know the results of these tests I get them.  I would like you to follow-up with me in 1 month.  If your pain has not resolved by then we may send you to the gastroenterologist.  Have a great day,  Frederic JerichoDan Seamus Warehime  Famotidine tablets or gelcaps What is this medicine? FAMOTIDINE (fa MOE ti deen) is a type of antihistamine that blocks the release of stomach acid. It is used to treat stomach or intestinal ulcers. It can also relieve heartburn from acid reflux. This medicine may be used for other purposes; ask your health care provider or pharmacist if you have questions. COMMON BRAND NAME(S): Heartburn Relief, Pepcid, Pepcid AC, Pepcid AC Maximum Strength What should I tell my health care provider before I take this medicine? They need to know if you have any of these conditions:  kidney or liver disease  trouble swallowing  an unusual or allergic reaction to famotidine, other medicines, foods, dyes, or preservatives  pregnant or trying to get pregnant  breast-feeding How should I use this medicine? Take this medicine by mouth with a glass of water. Follow the directions on the prescription label. If you only take this medicine once a day, take it at bedtime. Take your doses at regular intervals. Do not take your medicine more often than directed. Talk to your pediatrician regarding the use of  this medicine in children. Special care may be needed. Overdosage: If you think you have taken too much of this medicine contact a poison control center or emergency room at once. NOTE: This medicine is only for you. Do not share this medicine with others. What if I miss a dose? If you miss a dose, take it as soon as you can. If it is almost time for your next dose, take only that dose. Do not take double or extra doses. What may interact with this medicine?  delavirdine  itraconazole  ketoconazole This list may not describe all possible interactions. Give your health care provider a list of all the medicines, herbs, non-prescription drugs, or dietary supplements you use. Also tell them if you smoke, drink alcohol, or use illegal drugs. Some items may interact with your medicine. What should I watch for while using this medicine? Tell your doctor or health care professional if your condition does not start to get better or if it gets worse. Finish the full course of tablets prescribed, even if you feel better. Do not take with aspirin, ibuprofen or other antiinflammatory medicines. These can make your condition worse. Do not smoke cigarettes or drink alcohol. These cause irritation in your stomach and can increase the time it will take for ulcers to heal. If you get black, tarry stools or vomit up what looks like coffee grounds, call your doctor or health care  professional at once. You may have a bleeding ulcer. This medicine may cause a decrease in vitamin B12. You should make sure that you get enough vitamin B12 while you are taking this medicine. Discuss the foods you eat and the vitamins you take with your health care professional. What side effects may I notice from receiving this medicine? Side effects that you should report to your doctor or health care professional as soon as possible:  agitation, nervousness  confusion  hallucinations  skin rash, itching Side effects that  usually do not require medical attention (report to your doctor or health care professional if they continue or are bothersome):  constipation  diarrhea  dizziness  headache This list may not describe all possible side effects. Call your doctor for medical advice about side effects. You may report side effects to FDA at 1-800-FDA-1088. Where should I keep my medicine? Keep out of the reach of children. Store at room temperature between 15 and 30 degrees C (59 and 86 degrees F). Do not freeze. Throw away any unused medicine after the expiration date. NOTE: This sheet is a summary. It may not cover all possible information. If you have questions about this medicine, talk to your doctor, pharmacist, or health care provider.  2020 Elsevier/Gold Standard (2016-12-10 13:17:50)

## 2018-11-08 NOTE — Assessment & Plan Note (Signed)
At least 2 months of abdominal pain that is diffusely upper quadrant.  Difficult to isolate a specific cause based on history and physical alone.  Recent CT has ruled out pancreatitis, appendicitis, cholecystitis.  Patient could still have cholelithiasis.  Likely to be GI related over GU or renal based on recent ED work-up.  CT scan did indicate possible hepatic steatosis.  Patient does not consume excessive amounts of alcohol, and is not obese. - RUQ ultrasound - Stool antigen test H. pylori - Begin Pepcid after stool antigen test - CMP - Hep C test -Follow-up in 1 month.  If patient pain has not resolved or causes not been identified, refer to gastroenterologist.

## 2018-11-10 LAB — COMPREHENSIVE METABOLIC PANEL
ALT: 16 IU/L (ref 0–32)
AST: 20 IU/L (ref 0–40)
Albumin/Globulin Ratio: 1.7 (ref 1.2–2.2)
Albumin: 4.5 g/dL (ref 3.9–5.0)
Alkaline Phosphatase: 60 IU/L (ref 39–117)
BUN/Creatinine Ratio: 10 (ref 9–23)
BUN: 6 mg/dL (ref 6–20)
Bilirubin Total: 0.3 mg/dL (ref 0.0–1.2)
CO2: 26 mmol/L (ref 20–29)
Calcium: 9.2 mg/dL (ref 8.7–10.2)
Chloride: 101 mmol/L (ref 96–106)
Creatinine, Ser: 0.62 mg/dL (ref 0.57–1.00)
GFR calc Af Amer: 142 mL/min/{1.73_m2} (ref >59)
GFR calc non Af Amer: 123 mL/min/{1.73_m2} (ref >59)
Globulin, Total: 2.7 g/dL (ref 1.5–4.5)
Glucose: 84 mg/dL (ref 65–99)
Potassium: 4.4 mmol/L (ref 3.5–5.2)
Sodium: 138 mmol/L (ref 134–144)
Total Protein: 7.2 g/dL (ref 6.0–8.5)

## 2018-11-10 LAB — HEPATITIS C ANTIBODY: Hep C Virus Ab: 0.1 s/co ratio (ref 0.0–0.9)

## 2018-11-13 ENCOUNTER — Telehealth: Payer: Self-pay | Admitting: Family Medicine

## 2018-11-13 ENCOUNTER — Encounter: Payer: Self-pay | Admitting: Family Medicine

## 2018-11-13 NOTE — Telephone Encounter (Signed)
Pt called to see if she could get a letter to return to work stating that she can return and has no restrictions. Please give pt a call back.

## 2018-11-13 NOTE — Telephone Encounter (Signed)
There is now a return to work note in the box up front for her.

## 2018-11-14 ENCOUNTER — Other Ambulatory Visit: Payer: Self-pay

## 2018-11-14 ENCOUNTER — Ambulatory Visit (HOSPITAL_COMMUNITY)
Admission: RE | Admit: 2018-11-14 | Discharge: 2018-11-14 | Disposition: A | Discharge status: Home / Self Care | Payer: BC Managed Care – PPO | Source: Ambulatory Visit | Attending: Family Medicine | Admitting: Family Medicine

## 2018-11-14 DIAGNOSIS — R1013 Epigastric pain: Secondary | ICD-10-CM | POA: Diagnosis not present

## 2018-11-14 NOTE — Telephone Encounter (Signed)
Pt picked up the letter.April Zimmerman Rumple, CMA

## 2018-11-15 ENCOUNTER — Telehealth: Payer: Self-pay | Admitting: Family Medicine

## 2018-11-15 NOTE — Telephone Encounter (Signed)
Left voicemail for patient informing her that the right upper quadrant ultrasound was negative, to see if she was having any more abdominal pain, and to inform her that we still needed a sample for the stool antigen test to test for H. pylori.  Advised her to call back at the office number if she had any questions.

## 2018-11-17 LAB — SPECIMEN STATUS REPORT

## 2018-11-27 LAB — H. PYLORI ANTIGEN, STOOL

## 2019-03-13 ENCOUNTER — Other Ambulatory Visit: Payer: Self-pay

## 2019-03-13 ENCOUNTER — Encounter: Payer: Self-pay | Admitting: Family Medicine

## 2019-03-13 ENCOUNTER — Ambulatory Visit (INDEPENDENT_AMBULATORY_CARE_PROVIDER_SITE_OTHER): Payer: BC Managed Care – PPO | Admitting: Family Medicine

## 2019-03-13 DIAGNOSIS — J302 Other seasonal allergic rhinitis: Secondary | ICD-10-CM | POA: Insufficient documentation

## 2019-03-13 DIAGNOSIS — L720 Epidermal cyst: Secondary | ICD-10-CM

## 2019-03-13 NOTE — Patient Instructions (Signed)
It is wonderful to meet you today.  You have what is called an epidermoid cyst.  We drained it today which should provide some symptomatic relief but ultimate treatment is excision of the whole capsule.  That will need to be done in our Derm clinic.  I would like you to follow-up with them in approximately 1 month for an appointment to cut it out.  If you notice increased pain, swelling, fever please reach out because you will need to be seen.  You will have increased pain from the procedure today when the lidocaine wears off take ibuprofen 600 mg every 6 hours as needed for the pain.  Regarding your allergies I would like you to start using Flonase daily.  If you have any questions or concerns please feel free to reach out.

## 2019-03-13 NOTE — Progress Notes (Signed)
    Subjective:  Robin Flynn is a 28 y.o. female who presents to the [facility] today with a chief complaint of a painful mole.   HPI: Patient reports she has had a small month on her right side for 10 years.  She is seen multiple doctors for and they recommend just observing it.  Over the last 2 weeks has become extremely painful and swollen.  She cannot lay on that side.  She has been using warm compresses which relieves the pain for short.  But it then comes back.  She is requesting some form of relief today.  She is also complaining of seasonal allergies.  She says that she takes Benadryl nightly which knocks her fall asleep but she does not know if it actually helps with her symptoms.  She has stuffy nose, cough that has worsened for the past 3 months.  She has tried second-generation antihistamines with little to no symptomatic relief.  Objective:  Physical Exam: BP 100/60   Pulse 65   Wt 115 lb 8 oz (52.4 kg)   LMP 03/04/2019   SpO2 99%   BMI 18.09 kg/m   Gen: NAD, resting comfortably, grimaces when palpation of abscess Pulm: Normal work of breathing GI:  Nondistended. MSK: no edema, cyanosis, or clubbing noted Skin: warm, dry, raised erythematous lesion on right side, see picture below Neuro: grossly normal, moves all extremities Psych: Normal affect and thought content   No results found for this or any previous visit (from the past 72 hour(s)).  PRE-OP DIAGNOSIS: Epidermoid cyst  POST-OP DIAGNOSIS: Same  PROCEDURE: incision and drainage of abscess Performing Physician: Gifford Shave, MD  Supervising Physician (if applicable): Dorris Singh, MD    PROCEDURE:  A timeout protocol was performed prior to initiating the procedure.  The area was prepared and draped in the usual, sterile manner. The site was anesthetized with 1 % lidocaine with epinephrine. A linear incision along the local skin lines was made and the purulent material expressed. The abcess was explored thoroughly  and sequestered pockets were opened. Bleeding was minimal.    Followup: The patient tolerated the procedure well without complications.  Standard post-procedure care is explained and return precautions are given.   Assessment/Plan:  Epidermoid cyst of skin Patient reports that this has been on her right side for approximately 10 years.  Has never become less inflamed or usually ruptures prior to being seen. -Incision and drainage today -Follow-up in Derm clinic in 1 month for evaluation and possible excision  Seasonal allergies Patient reports she has had allergic rhinitis increasing in severity over the past 3 months.  She has been taking Benadryl daily although she says she sleeps but is unsure if it actually helps with the symptoms.  Tried taking over-the-counter Claritin and Flonase with little to no relief. -Patient will use Flonase daily     Gifford Shave, MD  PGY-1, [facility]  03/13/19 10:42 AM

## 2019-03-13 NOTE — Assessment & Plan Note (Signed)
Patient reports she has had allergic rhinitis increasing in severity over the past 3 months.  She has been taking Benadryl daily although she says she sleeps but is unsure if it actually helps with the symptoms.  Tried taking over-the-counter Claritin and Flonase with little to no relief. -Patient will use Flonase daily

## 2019-03-13 NOTE — Addendum Note (Signed)
Addended by: Owens Shark, Aicha Clingenpeel on: 03/13/2019 11:25 AM   Modules accepted: Level of Service

## 2019-03-13 NOTE — Assessment & Plan Note (Signed)
Patient reports that this has been on her right side for approximately 10 years.  Has never become less inflamed or usually ruptures prior to being seen. -Incision and drainage today -Follow-up in Derm clinic in 1 month for evaluation and possible excision

## 2019-03-22 ENCOUNTER — Ambulatory Visit: Payer: BC Managed Care – PPO | Admitting: Family Medicine

## 2019-04-22 ENCOUNTER — Emergency Department (HOSPITAL_COMMUNITY): Payer: BC Managed Care – PPO

## 2019-04-22 ENCOUNTER — Emergency Department (HOSPITAL_COMMUNITY)
Admission: EM | Admit: 2019-04-22 | Discharge: 2019-04-22 | Disposition: A | Discharge status: Home / Self Care | Payer: BC Managed Care – PPO | Attending: Emergency Medicine | Admitting: Emergency Medicine

## 2019-04-22 ENCOUNTER — Other Ambulatory Visit: Payer: Self-pay

## 2019-04-22 DIAGNOSIS — D72829 Elevated white blood cell count, unspecified: Secondary | ICD-10-CM | POA: Insufficient documentation

## 2019-04-22 DIAGNOSIS — F17200 Nicotine dependence, unspecified, uncomplicated: Secondary | ICD-10-CM | POA: Insufficient documentation

## 2019-04-22 DIAGNOSIS — R102 Pelvic and perineal pain: Secondary | ICD-10-CM | POA: Diagnosis not present

## 2019-04-22 DIAGNOSIS — Z79899 Other long term (current) drug therapy: Secondary | ICD-10-CM | POA: Insufficient documentation

## 2019-04-22 LAB — BASIC METABOLIC PANEL
Anion gap: 8 (ref 5–15)
BUN: 15 mg/dL (ref 6–20)
CO2: 26 mmol/L (ref 22–32)
Calcium: 9 mg/dL (ref 8.9–10.3)
Chloride: 106 mmol/L (ref 98–111)
Creatinine, Ser: 0.57 mg/dL (ref 0.44–1.00)
GFR calc Af Amer: >60 mL/min (ref >60)
GFR calc non Af Amer: >60 mL/min (ref >60)
Glucose, Bld: 101 mg/dL — ABNORMAL HIGH (ref 70–99)
Potassium: 3.4 mmol/L — ABNORMAL LOW (ref 3.5–5.1)
Sodium: 140 mmol/L (ref 135–145)

## 2019-04-22 LAB — CBC WITH DIFFERENTIAL/PLATELET
Abs Immature Granulocytes: 0.06 10*3/uL (ref 0.00–0.07)
Basophils Absolute: 0.1 10*3/uL (ref 0.0–0.1)
Basophils Relative: 0 %
Eosinophils Absolute: 0.2 10*3/uL (ref 0.0–0.5)
Eosinophils Relative: 1 %
HCT: 34.5 % — ABNORMAL LOW (ref 36.0–46.0)
Hemoglobin: 11.2 g/dL — ABNORMAL LOW (ref 12.0–15.0)
Immature Granulocytes: 0 %
Lymphocytes Relative: 14 %
Lymphs Abs: 2.4 10*3/uL (ref 0.7–4.0)
MCH: 31 pg (ref 26.0–34.0)
MCHC: 32.5 g/dL (ref 30.0–36.0)
MCV: 95.6 fL (ref 80.0–100.0)
Monocytes Absolute: 1 10*3/uL (ref 0.1–1.0)
Monocytes Relative: 5 %
Neutro Abs: 14.1 10*3/uL — ABNORMAL HIGH (ref 1.7–7.7)
Neutrophils Relative %: 80 %
Platelets: 191 10*3/uL (ref 150–400)
RBC: 3.61 MIL/uL — ABNORMAL LOW (ref 3.87–5.11)
RDW: 14.7 % (ref 11.5–15.5)
WBC: 17.9 10*3/uL — ABNORMAL HIGH (ref 4.0–10.5)
nRBC: 0 % (ref 0.0–0.2)

## 2019-04-22 LAB — I-STAT BETA HCG BLOOD, ED (MC, WL, AP ONLY): I-stat hCG, quantitative: <5 m[IU]/mL (ref <5)

## 2019-04-22 LAB — URINALYSIS, ROUTINE W REFLEX MICROSCOPIC
Bilirubin Urine: NEGATIVE
Glucose, UA: NEGATIVE mg/dL
Ketones, ur: NEGATIVE mg/dL
Leukocytes,Ua: NEGATIVE
Nitrite: NEGATIVE
Protein, ur: 30 mg/dL — AB
Specific Gravity, Urine: 1.019 (ref 1.005–1.030)
pH: 8 (ref 5.0–8.0)

## 2019-04-22 MED ORDER — KETOROLAC TROMETHAMINE 30 MG/ML IJ SOLN
15.0000 mg | Freq: Once | INTRAMUSCULAR | Status: AC
  Administered 2019-04-22: 15 mg via INTRAVENOUS
  Filled 2019-04-22: qty 1

## 2019-04-22 MED ORDER — IOHEXOL 300 MG/ML  SOLN
100.0000 mL | Freq: Once | INTRAMUSCULAR | Status: AC | PRN
  Administered 2019-04-22: 100 mL via INTRAVENOUS

## 2019-04-22 MED ORDER — ONDANSETRON HCL 4 MG/2ML IJ SOLN
4.0000 mg | Freq: Once | INTRAMUSCULAR | Status: AC
  Administered 2019-04-22: 4 mg via INTRAVENOUS
  Filled 2019-04-22: qty 2

## 2019-04-22 MED ORDER — SODIUM CHLORIDE 0.9 % IV BOLUS
500.0000 mL | Freq: Once | INTRAVENOUS | Status: AC
  Administered 2019-04-22: 500 mL via INTRAVENOUS

## 2019-04-22 MED ORDER — SODIUM CHLORIDE (PF) 0.9 % IJ SOLN
INTRAMUSCULAR | Status: AC
  Filled 2019-04-22: qty 50

## 2019-04-22 MED ORDER — IBUPROFEN 400 MG PO TABS: 400.0000 mg | ORAL_TABLET | Freq: Four times a day (QID) | ORAL | 0 refills | Status: DC | PRN

## 2019-04-22 NOTE — ED Provider Notes (Signed)
[facility]-EMERGENCY DEPT Provider Note   CSN: [id number] Arrival date & time: 04/22/19  7829     History Chief Complaint  Patient presents with  . Pelvic Pain    Robin Flynn is a 28 y.o. female.  HPI Patient presents with lower abdominal pain.  Began this morning.  Has had nausea vomiting and loose stools.  States she is on her first day of her cycle.  Often has pains with her cycle that is severe.  States no medicines work at home for it.  Has been vomiting.  Denies vaginal discharge.  States otherwise of normal menses.  Denies possibility of pregnancy.  No fevers or chills.  No sick contacts.    Past Medical History:  Diagnosis Date  . Allergy     Patient Active Problem List   Diagnosis Date Noted  . Epidermoid cyst of skin 03/13/2019  . Seasonal allergies 03/13/2019  . Abdominal pain 11/08/2018  . Chest pain at rest 03/04/2016  . On Depo-Provera 09/14/2013    No past surgical history on file.   OB History   No obstetric history on file.     Family History  Problem Relation Age of Onset  . Cancer Paternal Grandmother        lung    Social History   Tobacco Use  . Smoking status: Current Every Day Smoker  . Smokeless tobacco: Never Used  Substance Use Topics  . Alcohol use: Yes    Alcohol/week: 3.0 standard drinks    Types: 3 Standard drinks or equivalent per week  . Drug use: Yes    Types: Marijuana    Home Medications Prior to Admission medications   Medication Sig Start Date End Date Taking? Authorizing Provider  acetaminophen (TYLENOL) 325 MG tablet Take 650 mg by mouth every 6 (six) hours as needed for mild pain or headache.   Yes [provider]  famotidine (PEPCID) 20 MG tablet Take 1 tablet (20 mg total) by mouth 2 (two) times daily. Patient not taking: Reported on 04/22/2019 11/08/18   Sandre Kitty, MD  ibuprofen (ADVIL) 400 MG tablet Take 1 tablet (400 mg total) by mouth every 6 (six) hours as needed.  04/22/19   Benjiman Core, MD  medroxyPROGESTERone (DEPO-PROVERA) 150 MG/ML injection Inject 1 mL (150 mg total) into the muscle every 3 (three) months. Patient not taking: Reported on 10/31/2018 07/03/15   Reva Bores, MD  pantoprazole (PROTONIX) 40 MG tablet Take 1 tablet (40 mg total) by mouth daily. Patient not taking: Reported on 10/31/2018 03/02/16   Almon Hercules, MD    Allergies    Patient has no known allergies.  Review of Systems   Review of Systems  Constitutional: Negative for appetite change and fever.  HENT: Negative for congestion.   Respiratory: Negative for shortness of breath.   Cardiovascular: Negative for chest pain.  Gastrointestinal: Positive for abdominal pain, diarrhea, nausea and vomiting.  Genitourinary: Positive for vaginal bleeding. Negative for vaginal discharge and vaginal pain.  Musculoskeletal: Negative for back pain.  Skin: Negative for rash.  Neurological: Negative for tremors.  Psychiatric/Behavioral: Negative for confusion.    Physical Exam Updated Vital Signs BP (!) 96/56   Pulse (!) 57   Temp 97.8 F (36.6 C)   Resp 17   LMP 04/22/2019 (Exact Date)   SpO2 100%   Physical Exam Vitals and nursing note reviewed.  Constitutional:      Appearance: Normal appearance.  HENT:  Head: Atraumatic.  Eyes:     Extraocular Movements: Extraocular movements intact.  Cardiovascular:     Rate and Rhythm: Regular rhythm.  Pulmonary:     Breath sounds: No wheezing or rhonchi.  Abdominal:     Tenderness: There is abdominal tenderness.     Comments: Mild lower abdominal tenderness without rebound guarding or mass.  Musculoskeletal:        General: No tenderness.     Cervical back: Neck supple.     Right lower leg: No edema.     Left lower leg: No edema.  Skin:    General: Skin is warm.     Capillary Refill: Capillary refill takes less than 2 seconds.  Neurological:     Mental Status: She is oriented to person, place, and time.     ED  Results / Procedures / Treatments   Labs (all labs ordered are listed, but only abnormal results are displayed) Labs Reviewed  BASIC METABOLIC PANEL - Abnormal; Notable for the following components:      Result Value   Potassium 3.4 (*)    Glucose, Bld 101 (*)    All other components within normal limits  CBC WITH DIFFERENTIAL/PLATELET - Abnormal; Notable for the following components:   WBC 17.9 (*)    RBC 3.61 (*)    Hemoglobin 11.2 (*)    HCT 34.5 (*)    Neutro Abs 14.1 (*)    All other components within normal limits  URINALYSIS, ROUTINE W REFLEX MICROSCOPIC - Abnormal; Notable for the following components:   APPearance CLOUDY (*)    Hgb urine dipstick MODERATE (*)    Protein, ur 30 (*)    Bacteria, UA RARE (*)    All other components within normal limits  I-STAT BETA HCG BLOOD, ED ([facility], [facility], [facility] ONLY)    EKG None  Radiology US Transvaginal Non-OB  Result Date: 04/22/2019 CLINICAL DATA:  Pelvic pain EXAM: TRANSABDOMINAL AND TRANSVAGINAL ULTRASOUND OF PELVIS DOPPLER ULTRASOUND OF OVARIES TECHNIQUE: Both transabdominal and transvaginal ultrasound examinations of the pelvis were performed. Transabdominal technique was performed for global imaging of the pelvis including uterus, ovaries, adnexal regions, and pelvic cul-de-sac. It was necessary to proceed with endovaginal exam following the transabdominal exam to visualize the uterus, endometrium, ovaries, and adnexa. Color and duplex Doppler ultrasound was utilized to evaluate blood flow to the ovaries. COMPARISON:  None. FINDINGS: Uterus Measurements: 6.4 x 3.8 x 4.7 cm = volume: 59 mL. No fibroids or other mass visualized. Endometrium Thickness: 6 mm.  No focal abnormality visualized. Right ovary Measurements: 3.0 x 1.6 x 2.6 cm = volume: 6 mL. Normal appearance/no adnexal mass. Subcentimeter follicles. Left ovary Measurements: 3.2 x 1.6 x 2.0 cm = volume: 5 mL. Normal appearance/no adnexal mass. Subcentimeter follicles. Pulsed Doppler  evaluation of both ovaries demonstrates normal low-resistance arterial and venous waveforms. Other findings Small volume free fluid about the left ovary and adnexa. IMPRESSION: No ultrasound findings of the pelvis to explain acute pain. Small volume, nonspecific free fluid about the left ovary and adnexa, which may be functional. Electronically Signed   By: Lauralyn Primes M.D.   On: 04/22/2019 13:21   US Pelvis Complete  Result Date: 04/22/2019 CLINICAL DATA:  Pelvic pain EXAM: TRANSABDOMINAL AND TRANSVAGINAL ULTRASOUND OF PELVIS DOPPLER ULTRASOUND OF OVARIES TECHNIQUE: Both transabdominal and transvaginal ultrasound examinations of the pelvis were performed. Transabdominal technique was performed for global imaging of the pelvis including uterus, ovaries, adnexal regions, and pelvic cul-de-sac. It was necessary to  proceed with endovaginal exam following the transabdominal exam to visualize the uterus, endometrium, ovaries, and adnexa. Color and duplex Doppler ultrasound was utilized to evaluate blood flow to the ovaries. COMPARISON:  None. FINDINGS: Uterus Measurements: 6.4 x 3.8 x 4.7 cm = volume: 59 mL. No fibroids or other mass visualized. Endometrium Thickness: 6 mm.  No focal abnormality visualized. Right ovary Measurements: 3.0 x 1.6 x 2.6 cm = volume: 6 mL. Normal appearance/no adnexal mass. Subcentimeter follicles. Left ovary Measurements: 3.2 x 1.6 x 2.0 cm = volume: 5 mL. Normal appearance/no adnexal mass. Subcentimeter follicles. Pulsed Doppler evaluation of both ovaries demonstrates normal low-resistance arterial and venous waveforms. Other findings Small volume free fluid about the left ovary and adnexa. IMPRESSION: No ultrasound findings of the pelvis to explain acute pain. Small volume, nonspecific free fluid about the left ovary and adnexa, which may be functional. Electronically Signed   By: Lauralyn PrimesAlex  Bibbey M.D.   On: 04/22/2019 13:21   CT ABDOMEN PELVIS W CONTRAST  Result Date:  04/22/2019 CLINICAL DATA:  RIGHT lower quadrant pain for 2 days.  Pack acute EXAM: CT ABDOMEN AND PELVIS WITH CONTRAST TECHNIQUE: Multidetector CT imaging of the abdomen and pelvis was performed using the standard protocol following bolus administration of intravenous contrast. CONTRAST:  100mL OMNIPAQUE IOHEXOL 300 MG/ML  SOLN COMPARISON:  None. FINDINGS: Lower chest: Lung bases are clear. Hepatobiliary: No focal hepatic lesion. No biliary duct dilatation. Gallbladder is normal. Common bile duct is normal. Pancreas: Pancreas is normal. No ductal dilatation. No pancreatic inflammation. Spleen: Normal spleen Adrenals/urinary tract: Adrenal glands are normal. Kidneys enhance symmetrically. Ureters difficult to follow it is a little intra-abdominal fat but no evidence of hydroureter. No hydronephrosis. Bladder appears normal. No bladder calculi Stomach/Bowel: Stomach, duodenum and small bowel normal. Terminal ileum is normal. Appendix is partially imaged (image 48 through 56 of series 4/coronal). Appears normal. The ascending and transverse colon normal. Descending colon and rectosigmoid colon normal. Vascular/Lymphatic: Abdominal aorta is normal caliber. No periportal or retroperitoneal adenopathy. No pelvic adenopathy. Reproductive: Uterus is retroverted appears normal. No adnexal abnormality identified. A trace free fluid in the deep RIGHT pelvis has simple fluid attenuation (image 69/2. Other: No abscess or inflammation.  No intraperitoneal free air. Musculoskeletal: No aggressive osseous lesion. IMPRESSION: 1. No explanation for RIGHT lower quadrant pain. 2. Appendix appears normal. 3. Uterus and adnexa appear normal. 4. No obstructive uropathy identified. Electronically Signed   By: Genevive BiStewart  Edmunds M.D.   On: 04/22/2019 11:46   US Art/Ven Flow Abd Pelv Doppler  Result Date: 04/22/2019 CLINICAL DATA:  Pelvic pain EXAM: TRANSABDOMINAL AND TRANSVAGINAL ULTRASOUND OF PELVIS DOPPLER ULTRASOUND OF OVARIES  TECHNIQUE: Both transabdominal and transvaginal ultrasound examinations of the pelvis were performed. Transabdominal technique was performed for global imaging of the pelvis including uterus, ovaries, adnexal regions, and pelvic cul-de-sac. It was necessary to proceed with endovaginal exam following the transabdominal exam to visualize the uterus, endometrium, ovaries, and adnexa. Color and duplex Doppler ultrasound was utilized to evaluate blood flow to the ovaries. COMPARISON:  None. FINDINGS: Uterus Measurements: 6.4 x 3.8 x 4.7 cm = volume: 59 mL. No fibroids or other mass visualized. Endometrium Thickness: 6 mm.  No focal abnormality visualized. Right ovary Measurements: 3.0 x 1.6 x 2.6 cm = volume: 6 mL. Normal appearance/no adnexal mass. Subcentimeter follicles. Left ovary Measurements: 3.2 x 1.6 x 2.0 cm = volume: 5 mL. Normal appearance/no adnexal mass. Subcentimeter follicles. Pulsed Doppler evaluation of both ovaries demonstrates normal low-resistance arterial and venous  waveforms. Other findings Small volume free fluid about the left ovary and adnexa. IMPRESSION: No ultrasound findings of the pelvis to explain acute pain. Small volume, nonspecific free fluid about the left ovary and adnexa, which may be functional. Electronically Signed   By: Eddie Candle M.D.   On: 04/22/2019 13:21    Procedures Procedures (including critical care time)  Medications Ordered in ED Medications  sodium chloride (PF) 0.9 % injection (0 mLs  Hold 04/22/19 1100)  sodium chloride 0.9 % bolus 500 mL (0 mLs Intravenous Stopped 04/22/19 1014)  ondansetron (ZOFRAN) injection 4 mg (4 mg Intravenous Given 04/22/19 0913)  ketorolac (TORADOL) 30 MG/ML injection 15 mg (15 mg Intravenous Given 04/22/19 0913)  iohexol (OMNIPAQUE) 300 MG/ML solution 100 mL (100 mLs Intravenous Contrast Given 04/22/19 1100)    ED Course  I have reviewed the triage vital signs and the nursing notes.  Pertinent labs & imaging results that  were available during my care of the patient were reviewed by me and considered in my medical decision making (see chart for details).    MDM Rules/Calculators/A&P                       Patient presented with pelvic pain.  Has had pains like this with ovarian cyst in the past.  CT and ultrasound reassuring.  However did have a leukocytosis.  Urine does not show infection.  Pelvic exam showed only some blood and no purulence.  White count elevated potentially reactive.  Will discharge home with symptomatic treatment.  Will follow up with PCP for both the pelvic pain and the leukocytosis.  Return for worsening symptoms or development of other signs of infection Final Clinical Impression(s) / ED Diagnoses Final diagnoses:  Pelvic pain  Leukocytosis, unspecified type    Rx / DC Orders ED Discharge Orders         Ordered    ibuprofen (ADVIL) 400 MG tablet  Every 6 hours PRN     04/22/19 1413           Davonna Belling, MD 04/22/19 1416

## 2019-04-22 NOTE — ED Triage Notes (Signed)
Pt BIBA from home.   Per EMS- Pt reports awakened from sleep with abd pain, pelvic pain, n/v/d starting this AM.  Pt reports pain similar to previous ovarian cyst rupture pain.    BP 122/74 HR 54 SpO2- 99% RA 97.0 temporal  147 CBG  20 g L AC -4 mg Zofran given.

## 2019-04-22 NOTE — ED Notes (Signed)
US at bedside

## 2019-04-22 NOTE — ED Notes (Signed)
PT currently vomiting at bedside

## 2019-04-22 NOTE — ED Notes (Signed)
Pt reports starting period today, reports hx of ruptured ovarian cyst in the past.

## 2019-04-22 NOTE — ED Notes (Signed)
Patient transported to CT 

## 2019-04-22 NOTE — Discharge Instructions (Signed)
Follow-up with your doctor for both pelvic pain and elevated white count.  Do not appear to have a severe infection at this time.

## 2019-04-22 NOTE — ED Notes (Signed)
After walking to restroom PT c/o dizziness and pain in back.

## 2019-06-01 ENCOUNTER — Other Ambulatory Visit: Payer: Self-pay

## 2019-06-01 ENCOUNTER — Ambulatory Visit (INDEPENDENT_AMBULATORY_CARE_PROVIDER_SITE_OTHER): Payer: BC Managed Care – PPO | Admitting: Family Medicine

## 2019-06-01 VITALS — BP 102/62 | HR 72 | Wt 118.2 lb

## 2019-06-01 DIAGNOSIS — H9312 Tinnitus, left ear: Secondary | ICD-10-CM | POA: Diagnosis not present

## 2019-06-01 DIAGNOSIS — H9192 Unspecified hearing loss, left ear: Secondary | ICD-10-CM | POA: Diagnosis not present

## 2019-06-01 DIAGNOSIS — R1010 Upper abdominal pain, unspecified: Secondary | ICD-10-CM

## 2019-06-01 DIAGNOSIS — D72829 Elevated white blood cell count, unspecified: Secondary | ICD-10-CM | POA: Diagnosis not present

## 2019-06-01 DIAGNOSIS — H9319 Tinnitus, unspecified ear: Secondary | ICD-10-CM | POA: Insufficient documentation

## 2019-06-01 MED ORDER — MELOXICAM 15 MG PO TABS: 15.0000 mg | ORAL_TABLET | Freq: Every day | ORAL | 0 refills | Status: AC | PRN

## 2019-06-01 NOTE — Assessment & Plan Note (Signed)
Associated with menstrual cycles.  Does not desire treatment with contraceptives as she is intending to get pregnant at sometime in the future.  Has tried ibuprofen and Tylenol in the past.  Did not help. -Prescribed patient a trial of meloxicam to take daily when experiencing the symptoms.  Advised patient not to take when not having abdominal pain, and do not take if she suspects she is pregnant.

## 2019-06-01 NOTE — Assessment & Plan Note (Signed)
On previous visit to ED in December, patient had a leukocytosis without fever or other signs of infection.  She was experiencing dysmenorrhic abdominal pain at this time. -Follow-up CBC with differential

## 2019-06-01 NOTE — Progress Notes (Signed)
   [facility] Phone: [phone]     Robin Flynn - 29 y.o. female MRN [id number]  Date of birth: Feb 02, 1991  Subjective:   cc: Physical exam, tinnitus/hearing loss  HPI:  Tinnitus "ringing" in left ear.  Two to three times a day, for 35-72minutes.  Also has hearing loss in left ear.  No balance issues. Sometimes vision get blurry and she develops a mild headache when rinnging occurs.  No photophobia.  She goes into a silent room wait out these episodes.  Cyclical abd. pain: Patient states pain is better now.  Is timed with her menstrual cycles.  She didn't have abd pain when she was on Depo shot and did not have periods.  Currently wants to have kids and therefore does not want to try any contraceptives.. Has tried pamprin, Profen, other NSAIDs.  They did not help.    ROS: See HPI for pertinent positives and negatives  Family history reviewed for today's visit. No changes.  Social history- patient is a   smoker   Objective:   BP 102/62   Pulse 72   Wt 118 lb 3.2 oz (53.6 kg)   SpO2 100%   BMI 18.51 kg/m  Gen: Alert and oriented, no acute distress. HEENT: NCAT.  No scleral icterus.  Moist oral mucosa.  External auditory canal and tympanic membranes normal bilaterally.  Ossicles visualized. CV: Regular rhythm, normal rate.  No murmurs rubs or gallops.  2+ radial pulse bilaterally. Resp: Lungs clear to auscultation bilaterally.  No wheezes, no crackles, no tachypnea/increased work of breathing. GI: Soft, diffusely mildly tender on the left side.  Normal bowel sounds.  No organomegaly. Msk: Normal range of motion.  5/5 strength bilaterally in upper and lower extremities. Neuro: Cranial nerves II through XII grossly intact.  Sensation grossly intact in upper extremities Skin: No rashes, skin warm and dry. Psych: Normal affect.  Makes eye contact.  Spontaneous speech.   Hearing Screening   125Hz  250Hz  500Hz  1000Hz  2000Hz  3000Hz  4000Hz  6000Hz  8000Hz   Right  ear:   Pass 25 Pass  Pass    Left ear:   Fail Fail Fail  Fail      Visual Acuity Screening   Right eye Left eye Both eyes  Without correction: 20/20 20/20 20/20   With correction:        Assessment/Plan:   Hearing loss of left ear Patient states that she developed hearing loss while in the .  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  Patient also complaining of tinnitus 2-3 times a day.  No vertigo.  Tympanic membranes intact bilaterally on exam.  Failed hearing screen in left ear. -Referral for hearing aid evaluation  Leukocytosis On previous visit to ED in December, patient had a leukocytosis without fever or other signs of infection.  She was experiencing dysmenorrhic abdominal pain at this time. -Follow-up CBC with differential  Abdominal pain Associated with menstrual cycles.  Does not desire treatment with contraceptives as she is intending to get pregnant at sometime in the future.  Has tried ibuprofen and Tylenol in the past.  Did not help. -Prescribed patient a trial of meloxicam to take daily when experiencing the symptoms.  Advised patient not to take when not having abdominal pain, and do not take if she suspects she is pregnant.   , MD PGY-2 [facility]

## 2019-06-01 NOTE — Assessment & Plan Note (Addendum)
Patient states that she developed hearing loss while in the [employer].  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  Patient also complaining of tinnitus 2-3 times a day.  No vertigo.  Tympanic membranes intact bilaterally on exam.  Failed hearing screen in left ear. -Referral for hearing aid evaluation

## 2019-06-01 NOTE — Patient Instructions (Signed)
It was nice to see you again today,  For your cyclical abdominal pain, the only way to get rid of it is with hormonal contraception, but I understand you are trying to avoid that now.  The other treatment is to treat the pain with NSAIDs.  1 of these medications, meloxicam, is a once a day medication that is prescription only.  I have prescribed you 15 tablets of this.  You can try taking them the next time you get abdominal pain.  Only take 1 a day at most.  Only take them on the days that you expect to have abdominal pain.  Do not take them for more than 2 weeks at a time.  When she become pregnant, or believe you may be pregnant, you should not take this NSAID or any other NSAIDs such as ibuprofen, Advil, Motrin, Aleve.  We have evaluated your hearing today.  Let us know who we need to send the results to in regards to your military disability.  Follow-up as needed,  Have a great day,  Frederic Jericho, MD

## 2019-06-02 LAB — CBC WITH DIFFERENTIAL/PLATELET
Basophils Absolute: 0.1 10*3/uL (ref 0.0–0.2)
Basos: 1 %
EOS (ABSOLUTE): 0.5 10*3/uL — ABNORMAL HIGH (ref 0.0–0.4)
Eos: 6 %
Hematocrit: 35.8 % (ref 34.0–46.6)
Hemoglobin: 12.3 g/dL (ref 11.1–15.9)
Immature Grans (Abs): 0 10*3/uL (ref 0.0–0.1)
Immature Granulocytes: 0 %
Lymphocytes Absolute: 2.4 10*3/uL (ref 0.7–3.1)
Lymphs: 26 %
MCH: 31.7 pg (ref 26.6–33.0)
MCHC: 34.4 g/dL (ref 31.5–35.7)
MCV: 92 fL (ref 79–97)
Monocytes Absolute: 0.6 10*3/uL (ref 0.1–0.9)
Monocytes: 7 %
Neutrophils Absolute: 5.7 10*3/uL (ref 1.4–7.0)
Neutrophils: 60 %
Platelets: 210 10*3/uL (ref 150–450)
RBC: 3.88 x10E6/uL (ref 3.77–5.28)
RDW: 13 % (ref 11.7–15.4)
WBC: 9.4 10*3/uL (ref 3.4–10.8)

## 2019-08-01 ENCOUNTER — Telehealth (INDEPENDENT_AMBULATORY_CARE_PROVIDER_SITE_OTHER): Payer: BC Managed Care – PPO | Admitting: Family Medicine

## 2019-08-01 ENCOUNTER — Encounter (HOSPITAL_COMMUNITY): Payer: Self-pay | Admitting: Pharmacy Technician

## 2019-08-01 ENCOUNTER — Other Ambulatory Visit: Payer: Self-pay

## 2019-08-01 ENCOUNTER — Telehealth: Payer: Self-pay

## 2019-08-01 ENCOUNTER — Emergency Department (HOSPITAL_COMMUNITY)
Admission: EM | Admit: 2019-08-01 | Discharge: 2019-08-01 | Disposition: A | Discharge status: Home / Self Care | Payer: BC Managed Care – PPO | Attending: Emergency Medicine | Admitting: Emergency Medicine

## 2019-08-01 DIAGNOSIS — I951 Orthostatic hypotension: Secondary | ICD-10-CM

## 2019-08-01 DIAGNOSIS — F17200 Nicotine dependence, unspecified, uncomplicated: Secondary | ICD-10-CM | POA: Insufficient documentation

## 2019-08-01 DIAGNOSIS — N946 Dysmenorrhea, unspecified: Secondary | ICD-10-CM | POA: Diagnosis not present

## 2019-08-01 DIAGNOSIS — R112 Nausea with vomiting, unspecified: Secondary | ICD-10-CM | POA: Insufficient documentation

## 2019-08-01 DIAGNOSIS — F1210 Cannabis abuse, uncomplicated: Secondary | ICD-10-CM | POA: Insufficient documentation

## 2019-08-01 DIAGNOSIS — R197 Diarrhea, unspecified: Secondary | ICD-10-CM | POA: Diagnosis not present

## 2019-08-01 DIAGNOSIS — R1030 Lower abdominal pain, unspecified: Secondary | ICD-10-CM | POA: Insufficient documentation

## 2019-08-01 DIAGNOSIS — R1013 Epigastric pain: Secondary | ICD-10-CM | POA: Diagnosis present

## 2019-08-01 LAB — COMPREHENSIVE METABOLIC PANEL
ALT: 25 U/L (ref 0–44)
AST: 34 U/L (ref 15–41)
Albumin: 4.4 g/dL (ref 3.5–5.0)
Alkaline Phosphatase: 63 U/L (ref 38–126)
Anion gap: 13 (ref 5–15)
BUN: 9 mg/dL (ref 6–20)
CO2: 22 mmol/L (ref 22–32)
Calcium: 10.2 mg/dL (ref 8.9–10.3)
Chloride: 110 mmol/L (ref 98–111)
Creatinine, Ser: 0.76 mg/dL (ref 0.44–1.00)
GFR calc Af Amer: >60 mL/min (ref >60)
GFR calc non Af Amer: >60 mL/min (ref >60)
Glucose, Bld: 118 mg/dL — ABNORMAL HIGH (ref 70–99)
Potassium: 4 mmol/L (ref 3.5–5.1)
Sodium: 145 mmol/L (ref 135–145)
Total Bilirubin: 0.6 mg/dL (ref 0.3–1.2)
Total Protein: 7.9 g/dL (ref 6.5–8.1)

## 2019-08-01 LAB — CBC
HCT: 38.2 % (ref 36.0–46.0)
Hemoglobin: 12.6 g/dL (ref 12.0–15.0)
MCH: 29.9 pg (ref 26.0–34.0)
MCHC: 33 g/dL (ref 30.0–36.0)
MCV: 90.5 fL (ref 80.0–100.0)
Platelets: 207 10*3/uL (ref 150–400)
RBC: 4.22 MIL/uL (ref 3.87–5.11)
RDW: 14.6 % (ref 11.5–15.5)
WBC: 17.2 10*3/uL — ABNORMAL HIGH (ref 4.0–10.5)
nRBC: 0 % (ref 0.0–0.2)

## 2019-08-01 LAB — I-STAT BETA HCG BLOOD, ED (MC, WL, AP ONLY): I-stat hCG, quantitative: <5 m[IU]/mL (ref <5)

## 2019-08-01 LAB — URINALYSIS, ROUTINE W REFLEX MICROSCOPIC
Bilirubin Urine: NEGATIVE
Glucose, UA: NEGATIVE mg/dL
Ketones, ur: NEGATIVE mg/dL
Nitrite: NEGATIVE
Protein, ur: 100 mg/dL — AB
RBC / HPF: >50 RBC/hpf — ABNORMAL HIGH (ref 0–5)
Specific Gravity, Urine: 1.027 (ref 1.005–1.030)
pH: 8 (ref 5.0–8.0)

## 2019-08-01 LAB — LIPASE, BLOOD: Lipase: 25 U/L (ref 11–51)

## 2019-08-01 MED ORDER — LACTATED RINGERS IV BOLUS
1000.0000 mL | Freq: Once | INTRAVENOUS | Status: AC
  Administered 2019-08-01: 13:00:00 1000 mL via INTRAVENOUS

## 2019-08-01 MED ORDER — PROCHLORPERAZINE MALEATE 5 MG PO TABS: 5.0000 mg | ORAL_TABLET | Freq: Four times a day (QID) | ORAL | 1 refills | Status: AC | PRN

## 2019-08-01 MED ORDER — ONDANSETRON HCL 4 MG/2ML IJ SOLN
4.0000 mg | Freq: Once | INTRAMUSCULAR | Status: AC
  Administered 2019-08-01: 4 mg via INTRAVENOUS
  Filled 2019-08-01: qty 2

## 2019-08-01 MED ORDER — ONDANSETRON 4 MG PO TBDP
4.0000 mg | ORAL_TABLET | Freq: Once | ORAL | Status: AC | PRN
  Administered 2019-08-01: 4 mg via ORAL
  Filled 2019-08-01: qty 1

## 2019-08-01 MED ORDER — KETOROLAC TROMETHAMINE 15 MG/ML IJ SOLN
15.0000 mg | Freq: Once | INTRAMUSCULAR | Status: AC
  Administered 2019-08-01: 15 mg via INTRAVENOUS
  Filled 2019-08-01: qty 1

## 2019-08-01 MED ORDER — ONDANSETRON 4 MG PO TBDP: 4.0000 mg | ORAL_TABLET | Freq: Three times a day (TID) | ORAL | 2 refills | Status: DC | PRN

## 2019-08-01 MED ORDER — SODIUM CHLORIDE 0.9% FLUSH: 3.0000 mL | Freq: Once | INTRAVENOUS | Status: DC

## 2019-08-01 NOTE — Discharge Instructions (Addendum)
Thank you for allowing [place] to take care of you today.  Below is a summary of what we discussed:  1.  Painful menstrual cycles -We gave you some fluids, antinausea medicine, and pain medicine to treat your painful cramps. -Please go fill your Mobic prescription.  Take this medicine once a day as needed to help with the cramps.  Take this medication with food.  2. Orthostatic hypotension - This happens when your blood pressure drops too fast when you stand up. You may feel light headed or dizzy when this happens. If it does, sit back down and take it easy. Stay well hydrated.  - You may follow this up with your PCP.  3.  Follow-up -Reach out to your PCP to discuss what happened in the ED, and how to treat it moving forward.  If you have increased abdominal pain, fevers, increased nausea or vomiting that does not resolve please come back to the ED.

## 2019-08-01 NOTE — ED Provider Notes (Signed)
[facility] EMERGENCY DEPARTMENT Provider Note   CSN: [id number] Arrival date & time: 08/01/19  1110     History Chief Complaint  Patient presents with  . Abdominal Pain  . Emesis    Robin Flynn is a 29 y.o. female.  Robin Flynn is a 29 year old female with no significant past medical history presents with abdominal pain and emesis for the last day.  She states she consistently has episodes of abdominal pain, nausea and vomiting on the first day of her menstrual cycle.  Today is her first day of her menstrual cycle this month.  Patient states her periods are consistently 1 month apart.  They typically last 4 to 5 days with heavy bleeding.  She states that she soaks through 7 pads on her heaviest day.  Between her menstrual cycles, the patient is is in her normal state of health and does not have abdominal pain or nausea.  Her PCP has discussed treating with contraceptive medications, but the patient states that she wants to get pregnant at some point and has been handling her symptoms with ibuprofen and Tylenol.  The patient endorses having loose stools that are black and green in color over the past day.  She has had 2-3 total.  She denies any fevers, chest pain, SOB, hematochezia or dysuria.       Past Medical History:  Diagnosis Date  . Allergy    Patient Active Problem List   Diagnosis Date Noted  . Nausea & vomiting 08/01/2019  . Leukocytosis 06/01/2019  . 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 Tinnitus 06/01/2019  . Epidermoid cyst of skin 03/13/2019  . Seasonal allergies 03/13/2019  . Abdominal pain 11/08/2018  . Chest pain at rest 03/04/2016   History reviewed. No pertinent surgical history.   OB History   No obstetric history on file.    Family History  Problem Relation Age of Onset  . Cancer Paternal Grandmother        lung   Social History   Tobacco Use  . Smoking status: Current Every Day Smoker  . Smokeless tobacco: Never Used    Substance Use Topics  . Alcohol use: Yes    Alcohol/week: 3.0 standard drinks    Types: 3 Standard drinks or equivalent per week  . Drug use: Yes    Types: Marijuana   Home Medications Prior to Admission medications   Medication Sig Start Date End Date Taking? Authorizing Provider  acetaminophen (TYLENOL) 325 MG tablet Take 650 mg by mouth every 6 (six) hours as needed for mild pain or headache.    [provider]  famotidine (PEPCID) 20 MG tablet Take 1 tablet (20 mg total) by mouth 2 (two) times daily. Patient not taking: Reported on 04/22/2019 11/08/18   Sandre Kitty, MD  ibuprofen (ADVIL) 400 MG tablet Take 1 tablet (400 mg total) by mouth every 6 (six) hours as needed. 04/22/19   Benjiman Core, MD  medroxyPROGESTERone (DEPO-PROVERA) 150 MG/ML injection Inject 1 mL (150 mg total) into the muscle every 3 (three) months. Patient not taking: Reported on 10/31/2018 07/03/15   Reva Bores, MD  meloxicam (MOBIC) 15 MG tablet Take 1 tablet (15 mg total) by mouth daily as needed for pain. 06/01/19   Sandre Kitty, MD  ondansetron (ZOFRAN ODT) 4 MG disintegrating tablet Take 1 tablet (4 mg total) by mouth every 8 (eight) hours as needed for nausea or vomiting. 08/01/19   Oralia Manis, DO  pantoprazole (  PROTONIX) 40 MG tablet Take 1 tablet (40 mg total) by mouth daily. Patient not taking: Reported on 10/31/2018 03/02/16   Almon Hercules, MD  prochlorperazine (COMPAZINE) 5 MG tablet Take 1 tablet (5 mg total) by mouth every 6 (six) hours as needed for nausea or vomiting. 08/01/19   Oralia Manis, DO   Allergies    Patient has no known allergies.  Review of Systems   Review of Systems  All other systems reviewed and are negative.  Physical Exam Updated Vital Signs BP 124/66 (BP Location: Right Arm)   Pulse (!) 50   Temp 97.9 F (36.6 C) (Oral)   Resp 14   SpO2 100%   Physical Exam Vitals reviewed.  Constitutional:      General: She is not in acute distress.     Appearance: She is normal weight. She is not ill-appearing, toxic-appearing or diaphoretic.  HENT:     Head: Normocephalic and atraumatic.  Cardiovascular:     Rate and Rhythm: Regular rhythm. Bradycardia present.     Heart sounds: Normal heart sounds. No murmur. No friction rub. No gallop.   Pulmonary:     Effort: Pulmonary effort is normal. No respiratory distress.     Breath sounds: Normal breath sounds. No wheezing or rales.  Abdominal:     General: Abdomen is flat. Bowel sounds are normal. There is no distension.     Palpations: Abdomen is soft.     Tenderness: There is abdominal tenderness in the epigastric area and suprapubic area. There is no right CVA tenderness or left CVA tenderness.     Hernia: No hernia is present.  Genitourinary:    Uterus: Tender. Not enlarged.      Adnexa: Right adnexa normal and left adnexa normal.       Right: No mass or tenderness.         Left: No mass or tenderness.    Skin:    General: Skin is warm.  Neurological:     General: No focal deficit present.     Mental Status: She is alert and oriented to person, place, and time.  Psychiatric:        Mood and Affect: Mood normal.    ED Results / Procedures / Treatments   Labs (all labs ordered are listed, but only abnormal results are displayed) Labs Reviewed  COMPREHENSIVE METABOLIC PANEL - Abnormal; Notable for the following components:      Result Value   Glucose, Bld 118 (*)    All other components within normal limits  CBC - Abnormal; Notable for the following components:   WBC 17.2 (*)    All other components within normal limits  LIPASE, BLOOD  URINALYSIS, ROUTINE W REFLEX MICROSCOPIC  I-STAT BETA HCG BLOOD, ED ([facility], [facility], [facility] ONLY)   EKG None  Radiology No results found.  Procedures Procedures (including critical care time)  Medications Ordered in ED Medications  sodium chloride flush (NS) 0.9 % injection 3 mL (has no administration in time range)  ondansetron (ZOFRAN-ODT)  disintegrating tablet 4 mg (4 mg Oral Given 08/01/19 1122)  ondansetron (ZOFRAN) injection 4 mg (4 mg Intravenous Given 08/01/19 1313)  lactated ringers bolus 1,000 mL (0 mLs Intravenous Stopped 08/01/19 1517)  ketorolac (TORADOL) 15 MG/ML injection 15 mg (15 mg Intravenous Given 08/01/19 1313)    ED Course  I have reviewed the triage vital signs and the nursing notes.  Pertinent labs & imaging results that were available during my care of the  patient were reviewed by me and considered in my medical decision making (see chart for details).    MDM Rules/Calculators/A&P                      Ms. Nilsen presents with abdominal pain, nausea and vomiting on the first day of her menstrual cycle.  Patient states that this is a normal occurrence for her and she has had multiple ED visits in the past for the same symptoms.  She states that antiemetic medications and pain medications helps her get through it and she usually goes home without any complications.  She has had a CT of the abdomen in December for the same issue and was normal.  She has a chronic leukocytosis.  Her vital signs are stable, however she is bradycardic to the low 50s.  Will treat with IV fluids and Toradol for now.  We will continue to monitor.  Patient feels much better after receiving Toradol shot and IV fluids.  We discussed how her PCP prescribed her Mobic, but the patient states she never filled this prescription.  I advised her to call the office to make sure she is taking her medications as prescribed by her PCP.  Patient is in agreement with the plan.  Will obtain orthostatic vital signs, as her BP is dropped to the low 90s from 120 from initial vital sign check.  Patient meets orthostatic hypotension criteria, however is asymptomatic.  Will encourage to increase p.o. intake as best she can follow-up with PCP.  Patient is medically stable for discharge.  Final Clinical Impression(s) / ED Diagnoses Final diagnoses:  Painful  menstrual periods  Orthostatic hypotension   Rx / DC Orders ED Discharge Orders    None       Earlene Plater, MD 08/01/19 1605    Isla Pence, MD 08/01/19 [phone]

## 2019-08-01 NOTE — Assessment & Plan Note (Addendum)
Patient with intractable nausea and vomiting.  Has not been able to tolerate any p.o. even ice chips.  Given that patient is not able to tolerate any p.o. and is trying to avoid the emergency department will give Zofran ODT.  This way can disintegrate.  I advised that she try this and see if she can take any p.o.  Is unable to continue to take p.o. oral Compazine was also provided.  If still unable to take p.o. patient will need to go to the emergency department as she will likely need IV hydration and IV antiemetics at that time.  Strict return precautions given.  I discussed with her talking to her PCP regarding management of this every month.  I did provide refills for both Zofran and Compazine to try and avoid poor hydration in the future.  This seems to happen every month with her menses.  I also advised scheduling ibuprofen every 6 hours 2 to 3 days prior to menses.  Patient states that ibuprofen tends to make her nauseous.  I advised if she cannot take any NSAIDs then to use Tylenol.  Strict return precautions given.  Follow-up with PCP if no improvement.  Advised ED precautions as well.

## 2019-08-01 NOTE — ED Notes (Signed)
On arrival to room Pt reported she was to dehydrated to give  Urine  Sample.

## 2019-08-01 NOTE — ED Notes (Signed)
This NT asked the pt if she was able to provide a urine sample and pt stated that was not able to.

## 2019-08-01 NOTE — Progress Notes (Signed)
[facility] Telemedicine Visit I connected with  Saul Dorsi on 08/01/19 by a video enabled telemedicine application and verified that I am speaking with the correct person using two identifiers.   I discussed the limitations of evaluation and management by telemedicine. The patient expressed understanding and agreed to proceed.   Patient consented to have virtual visit. Method of visit: Video  Encounter participants: Patient: Robin Flynn - located at Home Provider: Oralia Manis - located at [facility] [place] [place]  Others (if applicable): None   Chief Complaint: nausea   HPI: Nausea  Patient reports she gets severe cramps during her menses. She always goes to ED where they give her IV meds and fluids. Has been vomiting and diarrhea since 6AM. Reports she thinks she has dehydration and weakness. Can sit in a tub but nothing else. Unable to tolerate any PO. Not able to tolerate any PO. Tried sucking on ice cubes but unable to tolerate that. Menses started this AM. This happens every month on the first day. Has been off depo as she is trying to have kids. Patient is taking OTC tylenol and midol. Doctor prescribed an RX but is throwing it back up. Is still making tears. No urination since 6AM.   ROS: per HPI  Pertinent PMHx: none  Exam:  Respiratory: speaking full sentences, no increased WOB HEENT: somewhat dry  Assessment/Plan:  Nausea & vomiting Patient with intractable nausea and vomiting.  Has not been able to tolerate any p.o. even ice chips.  Given that patient is not able to tolerate any p.o. and is trying to avoid the emergency department will give Zofran ODT.  This way can disintegrate.  I advised that she try this and see if she can take any p.o.  Is unable to continue to take p.o. oral Compazine was also provided.  If still unable to take p.o. patient will need to go to the emergency department as she will likely need IV hydration and IV antiemetics at that time.   Strict return precautions given.  I discussed with her talking to her PCP regarding management of this every month.  I did provide refills for both Zofran and Compazine to try and avoid poor hydration in the future.  This seems to happen every month with her menses.  I also advised scheduling ibuprofen every 6 hours 2 to 3 days prior to menses.  Patient states that ibuprofen tends to make her nauseous.  I advised if she cannot take any NSAIDs then to use Tylenol.  Strict return precautions given.  Follow-up with PCP if no improvement.  Advised ED precautions as well.    Time spent during visit with patient: 15 minutes

## 2019-08-01 NOTE — ED Notes (Signed)
Patient has is aware that urine sample needed however is unable to provide a sample at this time.

## 2019-08-01 NOTE — ED Triage Notes (Signed)
Pt arrives pov with complaints of emesis, diarrhea and abd pain. States today is the first day of her menstrual cycle. Pt dry heaving in triage.

## 2019-08-01 NOTE — Telephone Encounter (Signed)
Patient calls nurse line with reports of nausea and vomiting. Patient reports that today is her first day of menstrual cycle. Patient states that this is a common occurrence and she has to frequently go to the ER for fluids, as she is unable to keep anything down on the first day of cycle.   Patient states that she cannot afford to keep going to the ED for this issue. Asking if there was any medication we could prescribe to alleviate nausea and vomiting.   PCP not in office today, scheduled virtual visit with access to care this AM.   Will forward to PCP and ATC provider Darin Engels)  Veronda Prude, RN

## 2020-03-04 ENCOUNTER — Ambulatory Visit: Payer: BC Managed Care – PPO | Admitting: Family Medicine

## 2020-03-04 NOTE — Progress Notes (Deleted)
    SUBJECTIVE:   CHIEF COMPLAINT / HPI:   Restarting depo: pt wanted to get pregnant last visit.   PERTINENT  PMH / PSH: ***  OBJECTIVE:   There were no vitals taken for this visit.  ***  ASSESSMENT/PLAN:   No problem-specific Assessment & Plan notes found for this encounter.     Sandre Kitty, MD [facility]

## 2020-05-19 ENCOUNTER — Other Ambulatory Visit (HOSPITAL_BASED_OUTPATIENT_CLINIC_OR_DEPARTMENT_OTHER): Payer: Self-pay | Admitting: Emergency Medicine

## 2020-05-19 ENCOUNTER — Other Ambulatory Visit: Payer: Self-pay

## 2020-05-19 ENCOUNTER — Emergency Department (HOSPITAL_BASED_OUTPATIENT_CLINIC_OR_DEPARTMENT_OTHER)
Admission: EM | Admit: 2020-05-19 | Discharge: 2020-05-19 | Disposition: A | Discharge status: Home / Self Care | Payer: BC Managed Care – PPO | Attending: Emergency Medicine | Admitting: Emergency Medicine

## 2020-05-19 ENCOUNTER — Encounter (HOSPITAL_BASED_OUTPATIENT_CLINIC_OR_DEPARTMENT_OTHER): Payer: Self-pay | Admitting: *Deleted

## 2020-05-19 DIAGNOSIS — N946 Dysmenorrhea, unspecified: Secondary | ICD-10-CM | POA: Insufficient documentation

## 2020-05-19 DIAGNOSIS — F17200 Nicotine dependence, unspecified, uncomplicated: Secondary | ICD-10-CM | POA: Diagnosis not present

## 2020-05-19 DIAGNOSIS — R1030 Lower abdominal pain, unspecified: Secondary | ICD-10-CM | POA: Diagnosis present

## 2020-05-19 DIAGNOSIS — Z20822 Contact with and (suspected) exposure to covid-19: Secondary | ICD-10-CM | POA: Insufficient documentation

## 2020-05-19 LAB — URINALYSIS, MICROSCOPIC (REFLEX)
RBC / HPF: >50 RBC/hpf (ref 0–5)
WBC, UA: >50 WBC/hpf (ref 0–5)

## 2020-05-19 LAB — URINALYSIS, ROUTINE W REFLEX MICROSCOPIC

## 2020-05-19 LAB — SARS CORONAVIRUS 2 (TAT 6-24 HRS): SARS Coronavirus 2: NEGATIVE

## 2020-05-19 LAB — PREGNANCY, URINE: Preg Test, Ur: NEGATIVE

## 2020-05-19 MED ORDER — KETOROLAC TROMETHAMINE 30 MG/ML IJ SOLN
15.0000 mg | Freq: Once | INTRAMUSCULAR | Status: AC
  Administered 2020-05-19: 15 mg via INTRAMUSCULAR

## 2020-05-19 MED ORDER — IBUPROFEN 800 MG PO TABS: 800.0000 mg | ORAL_TABLET | Freq: Three times a day (TID) | ORAL | 0 refills | Status: DC | PRN

## 2020-05-19 MED ORDER — ONDANSETRON HCL 4 MG/2ML IJ SOLN
INTRAMUSCULAR | Status: AC
  Administered 2020-05-19: 4 mg via INTRAVENOUS
  Filled 2020-05-19: qty 2

## 2020-05-19 MED ORDER — KETOROLAC TROMETHAMINE 15 MG/ML IJ SOLN
15.0000 mg | Freq: Once | INTRAMUSCULAR | Status: AC
  Administered 2020-05-19: 15 mg via INTRAVENOUS
  Filled 2020-05-19: qty 1

## 2020-05-19 MED ORDER — ONDANSETRON 4 MG PO TBDP: 4.0000 mg | ORAL_TABLET | Freq: Three times a day (TID) | ORAL | 0 refills | Status: DC | PRN

## 2020-05-19 MED ORDER — ONDANSETRON HCL 4 MG/2ML IJ SOLN: 4.0000 mg | Freq: Once | INTRAMUSCULAR | Status: AC

## 2020-05-19 MED ORDER — SODIUM CHLORIDE 0.9 % IV SOLN: INTRAVENOUS | Status: DC

## 2020-05-19 MED FILL — ONDANSETRON ODT 4 MG TABLET: 4 | 21 days supply | Qty: 18 | Fill #0

## 2020-05-19 MED FILL — IBUPROFEN 800 MG TAB: 800 | 7 days supply | Qty: 21 | Fill #0

## 2020-05-19 NOTE — ED Triage Notes (Signed)
Arrived via EMS secondary to severe abd pain, demonstrating nausea and vomiting, per EMS, pt states has this pain and episode with each menstrual cycle, IV access and IVF initiated by EMS secondary to questionable being orthostatic

## 2020-05-19 NOTE — ED Provider Notes (Signed)
[facility] EMERGENCY DEPARTMENT Provider Note   CSN: [id number] Arrival date & time: 05/19/20  0020     History Chief Complaint  Patient presents with  . Abdominal Pain    Arrived via EMS, stating she has nausea, vomiting and abd pain    Robin Flynn is a 30 y.o. female.  HPI Patient presents with abdominal pain.  Nausea vomiting.  Pain is in lower abdomen.  States she just started her menses today.  States whenever she starts her menses she gets pain like this.  States this is the typical pain she gets.  States 6 months out of the last year she has had to go to the ER for pain.  States the flow is little heavier than normal.  States the pain usually last 2 days of her cycle.  Pain is sharp.  Patient states the IV fluid in the bag is making her throw up.    Past Medical History:  Diagnosis Date  . Allergy     Patient Active Problem List   Diagnosis Date Noted  . Nausea & vomiting 08/01/2019  . Leukocytosis 06/01/2019  . 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 Tinnitus 06/01/2019  . Epidermoid cyst of skin 03/13/2019  . Seasonal allergies 03/13/2019  . Abdominal pain 11/08/2018  . Chest pain at rest 03/04/2016    History reviewed. No pertinent surgical history.   OB History   No obstetric history on file.     Family History  Problem Relation Age of Onset  . Cancer Paternal Grandmother        lung    Social History   Tobacco Use  . Smoking status: Current Every Day Smoker  . Smokeless tobacco: Never Used  Substance Use Topics  . Alcohol use: Yes    Alcohol/week: 3.0 standard drinks    Types: 3 Standard drinks or equivalent per week  . Drug use: Yes    Types: Marijuana    Home Medications Prior to Admission medications   Medication Sig Start Date End Date Taking? Authorizing Provider  famotidine (PEPCID) 20 MG tablet Take 1 tablet (20 mg total) by mouth 2 (two) times daily. Patient not taking: Reported on 04/22/2019 11/08/18   Sandre Kitty,  MD  ibuprofen (ADVIL) 400 MG tablet Take 1 tablet (400 mg total) by mouth every 6 (six) hours as needed. Patient not taking: Reported on 08/01/2019 04/22/19   Benjiman Core, MD  loratadine (CLARITIN) 10 MG tablet Take 10 mg by mouth daily as needed for allergies.    [provider]  medroxyPROGESTERone (DEPO-PROVERA) 150 MG/ML injection Inject 1 mL (150 mg total) into the muscle every 3 (three) months. Patient not taking: Reported on 10/31/2018 07/03/15   Reva Bores, MD  meloxicam (MOBIC) 15 MG tablet Take 1 tablet (15 mg total) by mouth daily as needed for pain. Patient not taking: Reported on 08/01/2019 06/01/19   Sandre Kitty, MD  ondansetron (ZOFRAN ODT) 4 MG disintegrating tablet Take 1 tablet (4 mg total) by mouth every 8 (eight) hours as needed for nausea or vomiting. 08/01/19   Oralia Manis, DO  pantoprazole (PROTONIX) 40 MG tablet Take 1 tablet (40 mg total) by mouth daily. Patient not taking: Reported on 10/31/2018 03/02/16   Almon Hercules, MD  prochlorperazine (COMPAZINE) 5 MG tablet Take 1 tablet (5 mg total) by mouth every 6 (six) hours as needed for nausea or vomiting. 08/01/19   Oralia Manis, DO    Allergies  Patient has no known allergies.  Review of Systems   Review of Systems  Constitutional: Positive for appetite change.  HENT: Negative for congestion.   Respiratory: Negative for shortness of breath.   Cardiovascular: Negative for chest pain.  Gastrointestinal: Positive for abdominal pain, nausea and vomiting.  Genitourinary: Positive for vaginal bleeding.  Musculoskeletal: Negative for back pain.  Skin: Negative for rash.  Neurological: Negative for weakness.  Psychiatric/Behavioral: Negative for confusion.    Physical Exam Updated Vital Signs BP (!) 94/55   Pulse (!) 46   Temp 98.2 F (36.8 C) (Oral)   Resp 15   Ht 5\' 4"  (1.626 m)   Wt 54.4 kg   LMP 05/19/2020   SpO2 99%   BMI 20.60 kg/m   Physical Exam Vitals and nursing note  reviewed.  HENT:     Head: Normocephalic.  Eyes:     General: No scleral icterus. Cardiovascular:     Rate and Rhythm: Normal rate and regular rhythm.  Pulmonary:     Breath sounds: Normal breath sounds.  Abdominal:     Tenderness: There is abdominal tenderness.     Hernia: No hernia is present.     Comments: Tenderness over lower abdomen.  Skin:    General: Skin is warm.     Capillary Refill: Capillary refill takes less than 2 seconds.  Neurological:     Mental Status: She is alert.     ED Results / Procedures / Treatments   Labs (all labs ordered are listed, but only abnormal results are displayed) Labs Reviewed  URINALYSIS, ROUTINE W REFLEX MICROSCOPIC  PREGNANCY, URINE    EKG None  Radiology No results found.  Procedures Procedures (including critical care time)  Medications Ordered in ED Medications  0.9 %  sodium chloride infusion ( Intravenous Stopped 05/19/20 0232)  ketorolac (TORADOL) 15 MG/ML injection 15 mg (has no administration in time range)  ondansetron (ZOFRAN) injection 4 mg (4 mg Intravenous Given 05/19/20 0034)  ketorolac (TORADOL) 15 MG/ML injection 15 mg (15 mg Intravenous Given 05/19/20 0222)    ED Course  I have reviewed the triage vital signs and the nursing notes.  Pertinent labs & imaging results that were available during my care of the patient were reviewed by me and considered in my medical decision making (see chart for details).    MDM Rules/Calculators/A&P                          Patient presents with lower abdominal pain.  Similar with previous menses.  States it is severe it is crampy.  Has been nauseous.  Feeling better after treatment with Toradol however states she has no desire to urinate yet.  States she thinks she may be able to do with some oral fluid.  Patient had stated earlier that IV fluid made her nauseous.  Urine pregnancy and urinalysis still pending.  Has had leukocytosis with previous episodes.  Care will be turned  over to Dr. 07/17/20 Final Clinical Impression(s) / ED Diagnoses Final diagnoses:  Menstrual pain    Rx / DC Orders ED Discharge Orders    None       Jacqulyn Bath, MD 05/19/20 [phone]

## 2020-05-19 NOTE — ED Notes (Signed)
Pt re-informed that urine sample is needed; pt says she will use call button when she has to go; EDP made aware

## 2020-05-19 NOTE — ED Notes (Signed)
Reminded of u/a, given po fluids and crackers

## 2020-05-19 NOTE — Discharge Instructions (Addendum)
Were seen in the emergency room today with lower abdominal pain in the setting of your menstrual cycle.  I am starting 2 medications at home to help with symptoms.  Please [facility] the [facility] to schedule follow-up appointment.  Return to the emergency department with any new or suddenly worsening symptoms.

## 2020-05-19 NOTE — ED Notes (Signed)
States abd pain is a 10 on 0-10 scale, will notify EDP, comfort measures provided, currently has intermittent moaning that is noted, closing eyes at times. IVF and antiemetic administered per ED Protocol Orders

## 2020-05-19 NOTE — ED Provider Notes (Signed)
Blood pressure (!) 94/55, pulse (!) 46, temperature 98.2 F (36.8 C), temperature source Oral, resp. rate 15, height 5\' 4"  (1.626 m), weight 54.4 kg, last menstrual period 05/19/2020, SpO2 99 %.  Assuming care from Dr. 07/17/2020.  In short, Robin Flynn is a 30 y.o. female with a chief complaint of Abdominal Pain (Arrived via EMS, stating she has nausea, vomiting and abd pain) .  Refer to the original H&P for additional details.  The current plan of care is to f/u on UA and urine pregnancy test.  Pregnancy negative. UA with blood limiting interpretation. COVID sent. Patient to follow results in MyChart.      37, MD 05/22/20 [phone]

## 2020-06-09 ENCOUNTER — Telehealth: Payer: Self-pay

## 2020-06-09 NOTE — Telephone Encounter (Signed)
Patient calls nurse line requesting to restart depo provera injections. Patient reports that she was instructed by OBGYN and ED provider to restart depo due to dysmenorrhea. Patient is requesting to come in for depo injection. Informed patient that provider visit would be necessary for restarting depo. Patient became frustrated and said that our office should be able to get the records from prior provider visit with OB and be able to initiate depo from other doctor's recommendations. Unable to find any documentation in chart review of these recommendations.  Informed patient that she would have to have provider visit to discuss restarting, that this could not be scheduled as a nurse visit. PCP does not have availability for another week. Offered to schedule appointment with different provider, phone was then disconnected.   FYI to PCP  Veronda Prude, RN

## 2020-07-13 IMAGING — CT CT ABDOMEN AND PELVIS WITH CONTRAST
2 of 4 series · 16 of 46 positions shown, 18 images · IV contrast (omnipaque)
Comparison: None.

CLINICAL DATA: Lower abdominal pain

EXAM:
CT ABDOMEN AND PELVIS WITH CONTRAST
TECHNIQUE: Multidetector CT imaging of the abdomen and pelvis was performed
using the standard protocol following bolus administration of
intravenous contrast.
CONTRAST:  100mL OMNIPAQUE IOHEXOL 300 MG/ML  SOLN

[Series 2: axial st · axial · 0.68mm/px · z∈[+1138,+1504]mm · 13 of 83 slices shown, 15 images]
[im 5/83  soft-tissue]
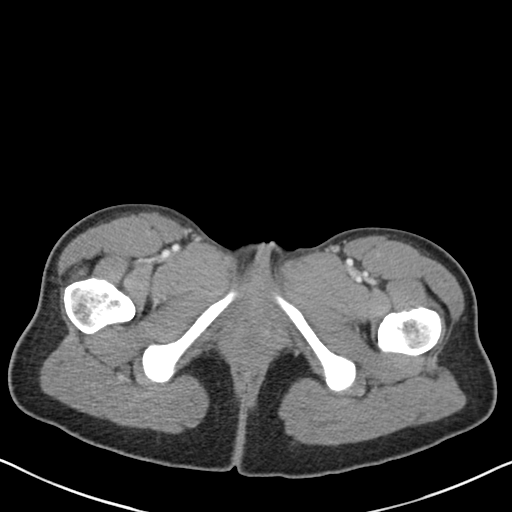
[im 5/83  bone]
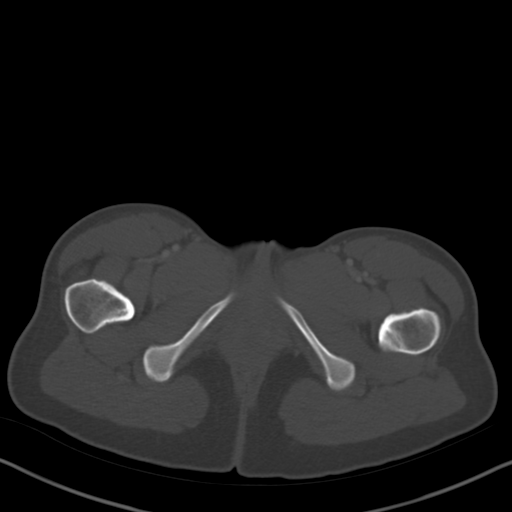
[im 13/83  soft-tissue]
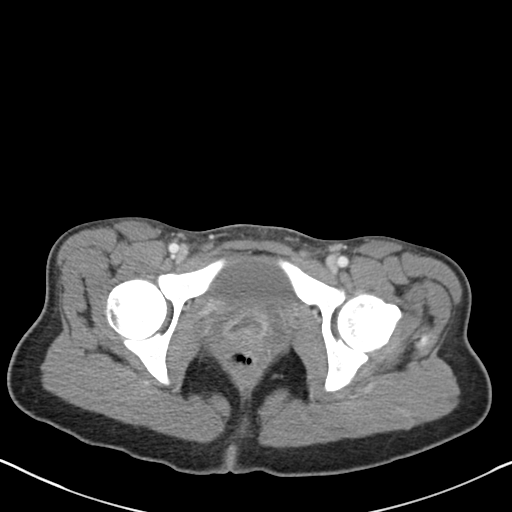
[im 18/83  soft-tissue]
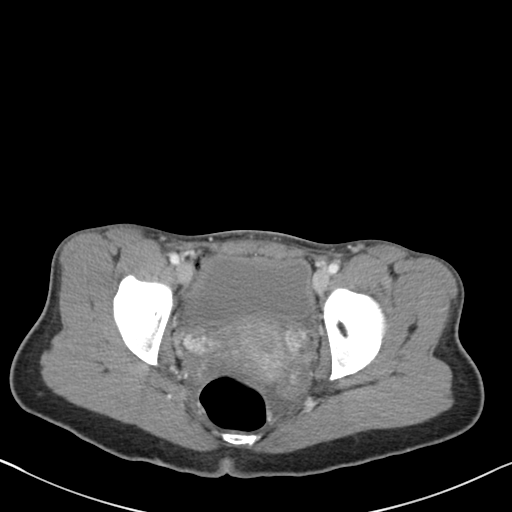
[im 22/83  soft-tissue]
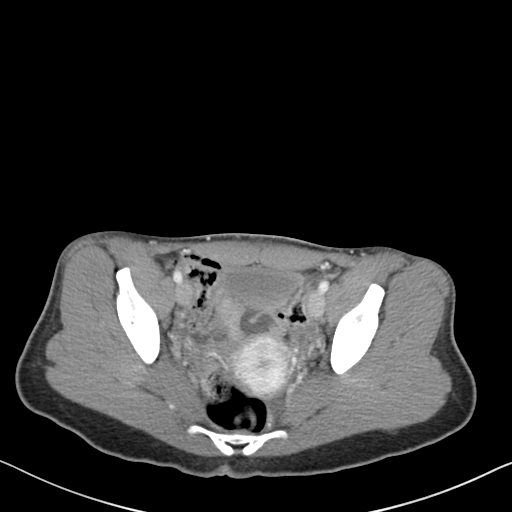
[im 31/83  soft-tissue]
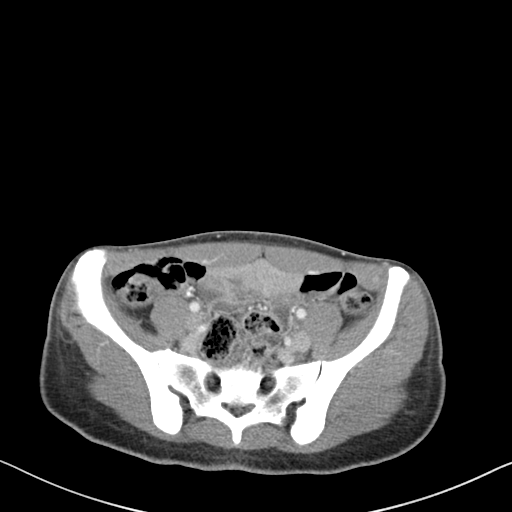
[im 35/83  soft-tissue]
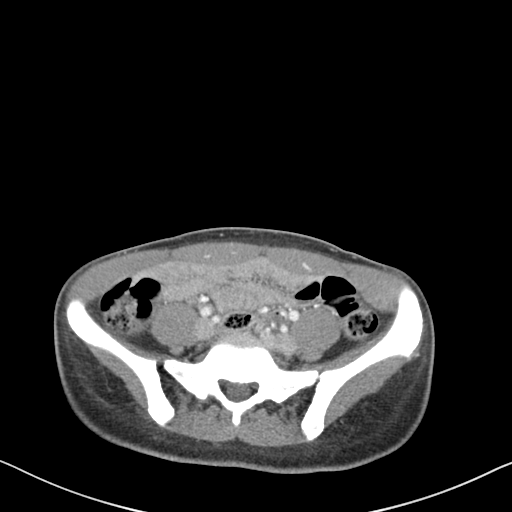
[im 44/83  soft-tissue]
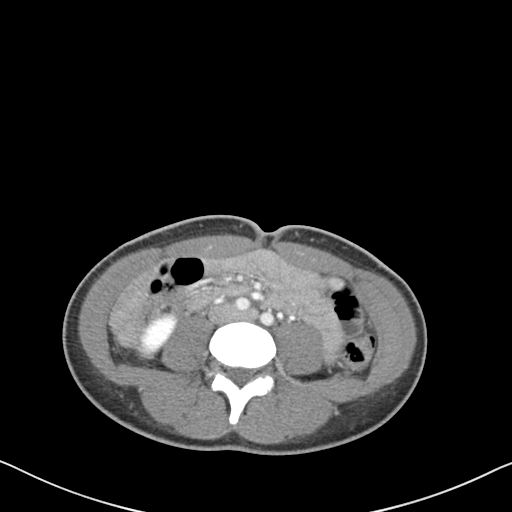
[im 48/83  soft-tissue]
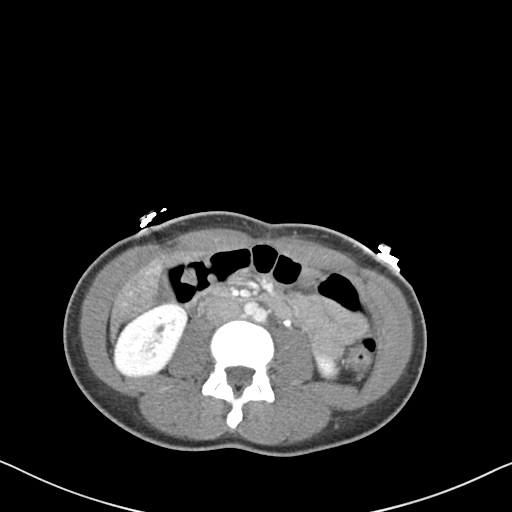
[im 52/83  soft-tissue]
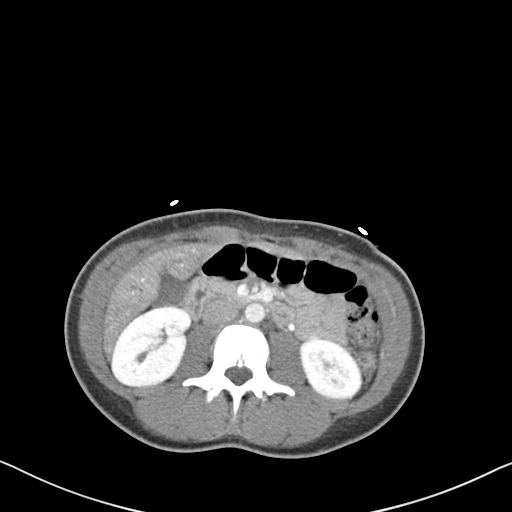
[im 52/83  bone]
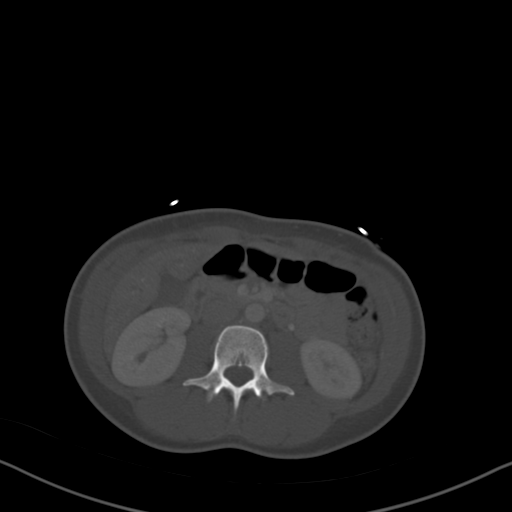
[im 61/83  soft-tissue]
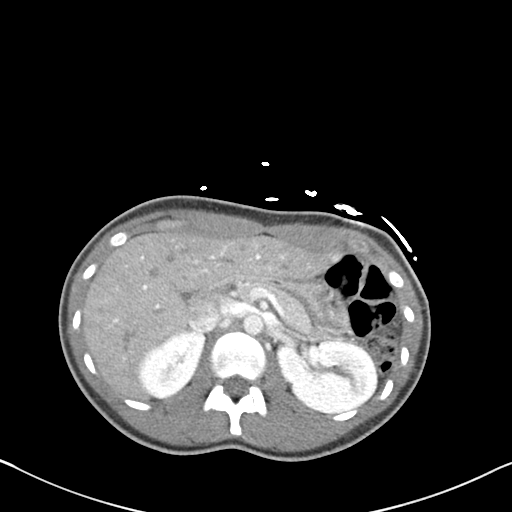
[im 65/83  soft-tissue]
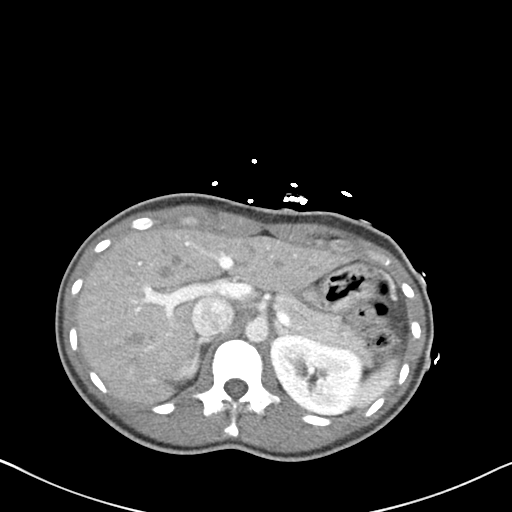
[im 70/83  soft-tissue]
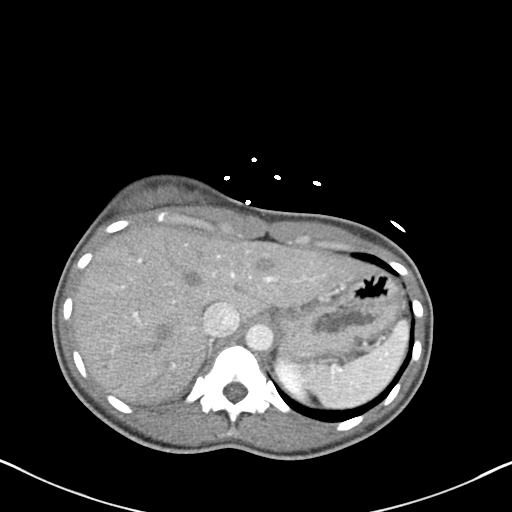
[im 78/83  soft-tissue]
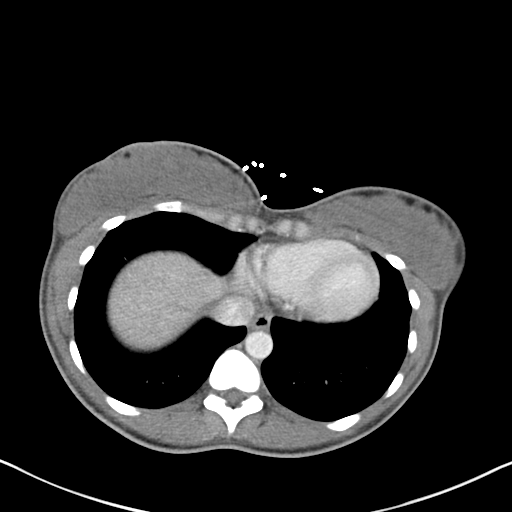

[Series 4: coronal st · coronal · 0.71mm/px · 3 of 93 slices shown]
[im 31/93  soft-tissue]
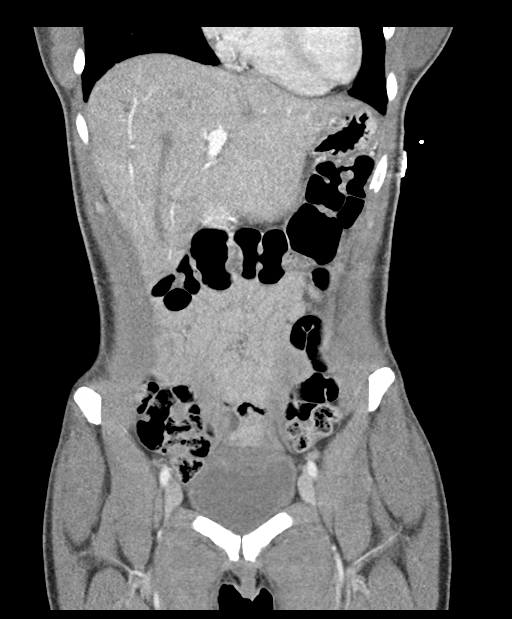
[im 41/93  soft-tissue]
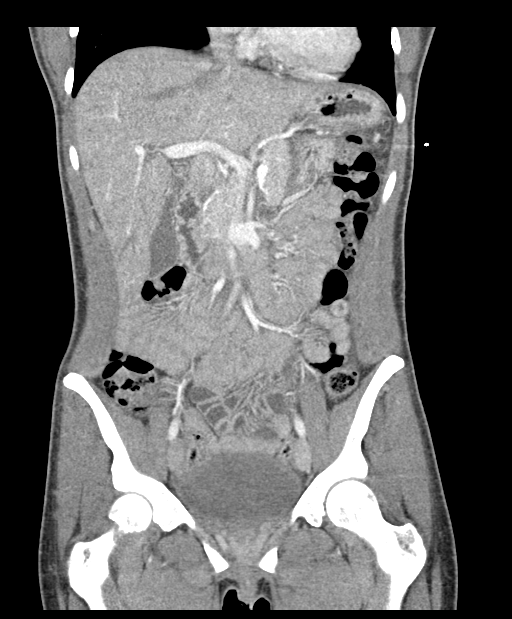
[im 52/93  soft-tissue]
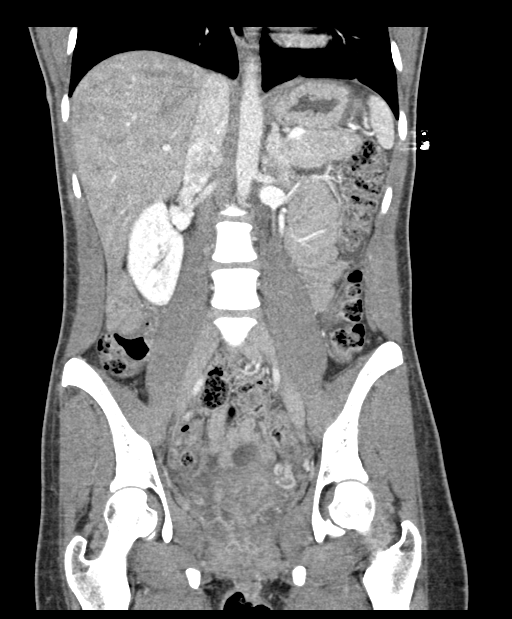

[16 of 46 positions shown; findings below may reference images not displayed]

FINDINGS: Lower chest: Lung bases are clear.

Hepatobiliary: There is a degree of hepatic steatosis. No focal
liver lesions are demonstrable. Gallbladder wall is not appreciably
thickened. There is no biliary duct dilatation.

Pancreas: There is no pancreatic mass or inflammatory focus.

Spleen: No splenic lesions are evident.

Adrenals/Urinary Tract: Adrenals bilaterally appear unremarkable.
Kidneys bilaterally show no evident mass or hydronephrosis on either
side. There is no evident renal or ureteral calculus on either side.
Urinary bladder is midline with wall thickness within normal limits.

Stomach/Bowel: There is moderate stool in the colon. There is no
bowel dilatation or air-fluid level to suggest bowel obstruction.
There is no evident free air or portal venous air.

Vascular/Lymphatic: No abdominal aortic aneurysm. No focal vascular
lesions evident. No adenopathy evident in the abdomen or pelvis.

Reproductive: Uterus is midline. There is no demonstrable pelvic
mass. There is a small amount of fluid in the dependent portion of
the pelvis which has attenuation values slightly higher than serous
fluid.

Other: Appendix appears unremarkable. No abscess evident in the
abdomen or pelvis.

Musculoskeletal: There are no blastic or lytic bone lesions. No
intramuscular lesions are evident.
IMPRESSION: 1. There is a small amount of fluid in the cul-de-sac which has
attenuation values higher than serous fluid. Suspect recent ovarian
cyst rupture with mild hemorrhage in the dependent portion of the
pelvis.

2. Appendix appears normal. No abscess in the abdomen or pelvis. No
bowel obstruction.

3. No evident renal or ureteral calculus. No hydronephrosis. Urinary
bladder wall thickness is unremarkable.

4.  Hepatic steatosis.

## 2020-08-23 ENCOUNTER — Other Ambulatory Visit: Payer: Self-pay

## 2020-08-23 ENCOUNTER — Emergency Department (HOSPITAL_COMMUNITY)
Admission: EM | Admit: 2020-08-23 | Discharge: 2020-08-23 | Disposition: A | Discharge status: Home / Self Care | Payer: BC Managed Care – PPO | Attending: Emergency Medicine | Admitting: Emergency Medicine

## 2020-08-23 ENCOUNTER — Emergency Department (HOSPITAL_COMMUNITY): Payer: BC Managed Care – PPO

## 2020-08-23 DIAGNOSIS — M542 Cervicalgia: Secondary | ICD-10-CM | POA: Insufficient documentation

## 2020-08-23 DIAGNOSIS — Y92410 Unspecified street and highway as the place of occurrence of the external cause: Secondary | ICD-10-CM | POA: Diagnosis not present

## 2020-08-23 DIAGNOSIS — F17200 Nicotine dependence, unspecified, uncomplicated: Secondary | ICD-10-CM | POA: Insufficient documentation

## 2020-08-23 DIAGNOSIS — M25511 Pain in right shoulder: Secondary | ICD-10-CM | POA: Insufficient documentation

## 2020-08-23 DIAGNOSIS — R0789 Other chest pain: Secondary | ICD-10-CM | POA: Diagnosis not present

## 2020-08-23 DIAGNOSIS — R109 Unspecified abdominal pain: Secondary | ICD-10-CM | POA: Insufficient documentation

## 2020-08-23 MED ORDER — LIDOCAINE 5 % EX PTCH
1.0000 | MEDICATED_PATCH | CUTANEOUS | 0 refills | Status: AC
Start: 2020-08-23 — End: ?

## 2020-08-23 MED ORDER — KETOROLAC TROMETHAMINE 60 MG/2ML IM SOLN
60.0000 mg | Freq: Once | INTRAMUSCULAR | Status: AC
  Administered 2020-08-23: 60 mg via INTRAMUSCULAR
  Filled 2020-08-23: qty 2

## 2020-08-23 MED ORDER — METHOCARBAMOL 500 MG PO TABS
500.0000 mg | ORAL_TABLET | Freq: Once | ORAL | Status: AC
  Administered 2020-08-23: 500 mg via ORAL
  Filled 2020-08-23: qty 1

## 2020-08-23 MED ORDER — METHOCARBAMOL 500 MG PO TABS: 500.0000 mg | ORAL_TABLET | Freq: Three times a day (TID) | ORAL | 0 refills | Status: AC | PRN
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
## 2020-09-17 ENCOUNTER — Other Ambulatory Visit: Payer: Self-pay

## 2020-09-17 ENCOUNTER — Emergency Department (HOSPITAL_COMMUNITY)
Admission: EM | Admit: 2020-09-17 | Discharge: 2020-09-17 | Disposition: A | Discharge status: Home / Self Care | Payer: BC Managed Care – PPO | Attending: Emergency Medicine | Admitting: Emergency Medicine

## 2020-09-17 ENCOUNTER — Encounter (HOSPITAL_COMMUNITY): Payer: Self-pay | Admitting: *Deleted

## 2020-09-17 DIAGNOSIS — N390 Urinary tract infection, site not specified: Secondary | ICD-10-CM | POA: Insufficient documentation

## 2020-09-17 DIAGNOSIS — B9689 Other specified bacterial agents as the cause of diseases classified elsewhere: Secondary | ICD-10-CM | POA: Insufficient documentation

## 2020-09-17 DIAGNOSIS — F17200 Nicotine dependence, unspecified, uncomplicated: Secondary | ICD-10-CM | POA: Insufficient documentation

## 2020-09-17 DIAGNOSIS — E860 Dehydration: Secondary | ICD-10-CM | POA: Insufficient documentation

## 2020-09-17 DIAGNOSIS — Z20822 Contact with and (suspected) exposure to covid-19: Secondary | ICD-10-CM | POA: Insufficient documentation

## 2020-09-17 DIAGNOSIS — R531 Weakness: Secondary | ICD-10-CM | POA: Insufficient documentation

## 2020-09-17 DIAGNOSIS — Y909 Presence of alcohol in blood, level not specified: Secondary | ICD-10-CM | POA: Insufficient documentation

## 2020-09-17 DIAGNOSIS — R197 Diarrhea, unspecified: Secondary | ICD-10-CM | POA: Insufficient documentation

## 2020-09-17 DIAGNOSIS — R112 Nausea with vomiting, unspecified: Secondary | ICD-10-CM | POA: Insufficient documentation

## 2020-09-17 LAB — CBC WITH DIFFERENTIAL/PLATELET
Abs Immature Granulocytes: 0.07 10*3/uL (ref 0.00–0.07)
Basophils Absolute: 0.1 10*3/uL (ref 0.0–0.1)
Basophils Relative: 0 %
Eosinophils Absolute: 0 10*3/uL (ref 0.0–0.5)
Eosinophils Relative: 0 %
HCT: 34 % — ABNORMAL LOW (ref 36.0–46.0)
Hemoglobin: 11.3 g/dL — ABNORMAL LOW (ref 12.0–15.0)
Immature Granulocytes: 1 %
Lymphocytes Relative: 9 %
Lymphs Abs: 1.3 10*3/uL (ref 0.7–4.0)
MCH: 30.1 pg (ref 26.0–34.0)
MCHC: 33.2 g/dL (ref 30.0–36.0)
MCV: 90.4 fL (ref 80.0–100.0)
Monocytes Absolute: 0.4 10*3/uL (ref 0.1–1.0)
Monocytes Relative: 3 %
Neutro Abs: 12.8 10*3/uL — ABNORMAL HIGH (ref 1.7–7.7)
Neutrophils Relative %: 87 %
Platelets: 215 10*3/uL (ref 150–400)
RBC: 3.76 MIL/uL — ABNORMAL LOW (ref 3.87–5.11)
RDW: 16.6 % — ABNORMAL HIGH (ref 11.5–15.5)
WBC: 14.7 10*3/uL — ABNORMAL HIGH (ref 4.0–10.5)
nRBC: 0 % (ref 0.0–0.2)

## 2020-09-17 LAB — COMPREHENSIVE METABOLIC PANEL
ALT: 18 U/L (ref 0–44)
AST: 30 U/L (ref 15–41)
Albumin: 4.5 g/dL (ref 3.5–5.0)
Alkaline Phosphatase: 60 U/L (ref 38–126)
Anion gap: 7 (ref 5–15)
BUN: 13 mg/dL (ref 6–20)
CO2: 29 mmol/L (ref 22–32)
Calcium: 9.6 mg/dL (ref 8.9–10.3)
Chloride: 107 mmol/L (ref 98–111)
Creatinine, Ser: 0.41 mg/dL — ABNORMAL LOW (ref 0.44–1.00)
GFR, Estimated: >60 mL/min (ref >60)
Glucose, Bld: 124 mg/dL — ABNORMAL HIGH (ref 70–99)
Potassium: 3.8 mmol/L (ref 3.5–5.1)
Sodium: 143 mmol/L (ref 135–145)
Total Bilirubin: 0.4 mg/dL (ref 0.3–1.2)
Total Protein: 7.9 g/dL (ref 6.5–8.1)

## 2020-09-17 LAB — URINALYSIS, ROUTINE W REFLEX MICROSCOPIC
Bacteria, UA: NONE SEEN
Bilirubin Urine: NEGATIVE
Glucose, UA: NEGATIVE mg/dL
Ketones, ur: NEGATIVE mg/dL
Nitrite: NEGATIVE
Protein, ur: 100 mg/dL — AB
RBC / HPF: >50 RBC/hpf — ABNORMAL HIGH (ref 0–5)
Specific Gravity, Urine: 1.026 (ref 1.005–1.030)
WBC, UA: >50 WBC/hpf — ABNORMAL HIGH (ref 0–5)
pH: 7 (ref 5.0–8.0)

## 2020-09-17 LAB — LIPASE, BLOOD: Lipase: 36 U/L (ref 11–51)

## 2020-09-17 LAB — I-STAT BETA HCG BLOOD, ED (MC, WL, AP ONLY): I-stat hCG, quantitative: <5 m[IU]/mL (ref <5)

## 2020-09-17 LAB — POC SARS CORONAVIRUS 2 AG -  ED: SARSCOV2ONAVIRUS 2 AG: NEGATIVE

## 2020-09-17 LAB — ETHANOL: Alcohol, Ethyl (B): <10 mg/dL (ref <10)

## 2020-09-17 MED ORDER — CEPHALEXIN 500 MG PO CAPS: 500.0000 mg | ORAL_CAPSULE | Freq: Two times a day (BID) | ORAL | 0 refills | Status: AC

## 2020-09-17 MED ORDER — KETOROLAC TROMETHAMINE 30 MG/ML IJ SOLN
15.0000 mg | Freq: Once | INTRAMUSCULAR | Status: AC
  Administered 2020-09-17: 15 mg via INTRAVENOUS
  Filled 2020-09-17: qty 1

## 2020-09-17 MED ORDER — ACETAMINOPHEN 325 MG PO TABS
650.0000 mg | ORAL_TABLET | Freq: Once | ORAL | Status: AC
  Administered 2020-09-17: 650 mg via ORAL
  Filled 2020-09-17: qty 2

## 2020-09-17 MED ORDER — SODIUM CHLORIDE 0.9 % IV SOLN
1.0000 g | Freq: Once | INTRAVENOUS | Status: AC
  Administered 2020-09-17: 1 g via INTRAVENOUS
  Filled 2020-09-17: qty 10

## 2020-09-17 MED ORDER — SODIUM CHLORIDE 0.9 % IV BOLUS
1000.0000 mL | Freq: Once | INTRAVENOUS | Status: AC
  Administered 2020-09-17: 1000 mL via INTRAVENOUS

## 2020-09-17 MED ORDER — ONDANSETRON HCL 4 MG/2ML IJ SOLN
4.0000 mg | Freq: Once | INTRAMUSCULAR | Status: AC
  Administered 2020-09-17: 4 mg via INTRAVENOUS
  Filled 2020-09-17: qty 2

## 2020-09-17 NOTE — ED Triage Notes (Signed)
Pt complains of abdominal cramping, nausea, emesis, diarrhea x 1 day. She reports hx of symptoms around here menstrual period.

## 2020-09-17 NOTE — Discharge Instructions (Signed)
As discussed, your evaluation today has been largely reassuring.  But, it is important that you monitor your condition carefully, and do not hesitate to return to the ED if you develop new, or concerning changes in your condition. ? ?Otherwise, please follow-up with your physician for appropriate ongoing care. ? ?

## 2020-09-17 NOTE — ED Provider Notes (Signed)
[facility]-EMERGENCY DEPT Provider Note   CSN: [id number] Arrival date & time: 09/17/20  1245     History Chief Complaint  Patient presents with  . Abdominal Pain  . Emesis    Robin Flynn is a 30 y.o. female.  HPI Patient presents concern of abdominal pain, nausea, vomiting, diarrhea, weakness, p.o. intolerance.  Patient notes that she has long history of similar episodes, typically in the first or second of her menstrual cycle.  That began yesterday.  Since that time she has had worsening weakness, anorexia, p.o. intolerance with after mentioned nausea, vomiting.  Abdominal pain is crampy, diffuse, similar to multiple prior events. She has seen her gynecologist, has discussed interventions, but has no current ongoing therapy for these monthly episodes.  She notes that she is trying to get pregnant, is not amenable to birth control.  Since onset no relief with anything.    Past Medical History:  Diagnosis Date  . Allergy     Patient Active Problem List   Diagnosis Date Noted  . Nausea & vomiting 08/01/2019  . Leukocytosis 06/01/2019  . 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 Tinnitus 06/01/2019  . Epidermoid cyst of skin 03/13/2019  . Seasonal allergies 03/13/2019  . Abdominal pain 11/08/2018  . Chest pain at rest 03/04/2016    History reviewed. No pertinent surgical history.   OB History   No obstetric history on file.     Family History  Problem Relation Age of Onset  . Cancer Paternal Grandmother        lung    Social History   Tobacco Use  . Smoking status: Current Every Day Smoker  . Smokeless tobacco: Never Used  Substance Use Topics  . Alcohol use: Yes    Alcohol/week: 3.0 standard drinks    Types: 3 Standard drinks or equivalent per week  . Drug use: Yes    Types: Marijuana    Home Medications Prior to Admission medications   Medication Sig Start Date End Date Taking? Authorizing Provider  famotidine (PEPCID) 20 MG  tablet Take 1 tablet (20 mg total) by mouth 2 (two) times daily. Patient not taking: Reported on 04/22/2019 11/08/18   Sandre Kitty, MD  ibuprofen (ADVIL) 800 MG tablet TAKE 1 TABLET BY MOUTH EVERY 8 HOURS AS NEEDED FOR MODERATE PAIN 05/19/20 05/19/21  Long, Arlyss Repress, MD  lidocaine (LIDODERM) 5 % Place 1 patch onto the skin daily. Remove & Discard patch within 12 hours or as directed by MD 08/23/20   Haskel Schroeder, PA-C  loratadine (CLARITIN) 10 MG tablet Take 10 mg by mouth daily as needed for allergies.    [provider]  medroxyPROGESTERone (DEPO-PROVERA) 150 MG/ML injection Inject 1 mL (150 mg total) into the muscle every 3 (three) months. Patient not taking: Reported on 10/31/2018 07/03/15   Reva Bores, MD  meloxicam (MOBIC) 15 MG tablet Take 1 tablet (15 mg total) by mouth daily as needed for pain. Patient not taking: Reported on 08/01/2019 06/01/19   Sandre Kitty, MD  methocarbamol (ROBAXIN) 500 MG tablet Take 1 tablet (500 mg total) by mouth every 8 (eight) hours as needed for muscle spasms. 08/23/20   Haskel Schroeder, PA-C  ondansetron (ZOFRAN-ODT) 4 MG disintegrating tablet TAKE 1 TABLET BY MOUTH EVERY 8 HOURS AS NEEDED 05/19/20 05/19/21  Long, Arlyss Repress, MD  pantoprazole (PROTONIX) 40 MG tablet Take 1 tablet (40 mg total) by mouth daily. Patient not taking: Reported on 10/31/2018  03/02/16   Almon Hercules, MD  prochlorperazine (COMPAZINE) 5 MG tablet Take 1 tablet (5 mg total) by mouth every 6 (six) hours as needed for nausea or vomiting. 08/01/19   Oralia Manis, DO    Allergies    Patient has no known allergies.  Review of Systems   Review of Systems  Constitutional:       Per HPI, otherwise negative  HENT:       Per HPI, otherwise negative  Respiratory:       Per HPI, otherwise negative  Cardiovascular:       Per HPI, otherwise negative  Gastrointestinal: Positive for abdominal pain, diarrhea, nausea and vomiting.  Endocrine:       Negative aside from  HPI  Genitourinary:       Neg aside from HPI   Musculoskeletal:       Per HPI, otherwise negative  Skin: Negative.   Neurological: Negative for syncope.    Physical Exam Updated Vital Signs BP (!) 108/52 (BP Location: Right Arm)   Pulse (!) 58   Temp 98 F (36.7 C) (Oral)   Resp 17   LMP 09/17/2020   SpO2 98%   Physical Exam Vitals and nursing note reviewed.  Constitutional:      Appearance: She is well-developed.     Comments: Uncomfortable appearing hirsuite young female awake and alert speaking clearly  HENT:     Head: Normocephalic and atraumatic.  Eyes:     Conjunctiva/sclera: Conjunctivae normal.  Cardiovascular:     Rate and Rhythm: Normal rate and regular rhythm.  Pulmonary:     Effort: Pulmonary effort is normal. No respiratory distress.     Breath sounds: Normal breath sounds. No stridor.  Abdominal:     General: There is no distension.     Tenderness: There is generalized abdominal tenderness. There is no guarding or rebound.  Skin:    General: Skin is warm and dry.  Neurological:     Mental Status: She is alert and oriented to person, place, and time.     Cranial Nerves: No cranial nerve deficit.     ED Results / Procedures / Treatments   Labs (all labs ordered are listed, but only abnormal results are displayed) Labs Reviewed  CBC WITH DIFFERENTIAL/PLATELET - Abnormal; Notable for the following components:      Result Value   WBC 14.7 (*)    RBC 3.76 (*)    Hemoglobin 11.3 (*)    HCT 34.0 (*)    RDW 16.6 (*)    Neutro Abs 12.8 (*)    All other components within normal limits  COMPREHENSIVE METABOLIC PANEL  ETHANOL  LIPASE, BLOOD  URINALYSIS, ROUTINE W REFLEX MICROSCOPIC  I-STAT BETA HCG BLOOD, ED ([facility], [facility], [facility] ONLY)  POC SARS CORONAVIRUS 2 AG -  ED    EKG None  Radiology No results found.  Procedures Procedures   Medications Ordered in ED Medications  ketorolac (TORADOL) 30 MG/ML injection 15 mg (has no administration in time  range)  ondansetron (ZOFRAN) injection 4 mg (has no administration in time range)  sodium chloride 0.9 % bolus 1,000 mL (1,000 mLs Intravenous New Bag/Given 09/17/20 1323)    ED Course  I have reviewed the triage vital signs and the nursing notes.  Pertinent labs & imaging results that were available during my care of the patient were reviewed by me and considered in my medical decision making (see chart for details).   Chart review notable for  similar prior visits about the same onset following initiation of menstrual cycle.  7:48 PM Not following 2 L fluid resuscitation, antiemetics, analgesics, the patient has had no episodes of vomiting.  She has been ambulatory.  She notes that she feels markedly better. Labs are reviewed again, notable for leukocytosis, and given the patient's presentation with nausea, vomiting, suspicion for reactive process.  Line she has mildly hypertensive, though this has been typical for her. Leukocytosis is consistent with multiple prior visits for similar presentation.  Given her improvement here, absence of ongoing pain, patient is appropriate for initiation of antibiotics, discharged with outpatient follow-up as needed. MDM Rules/Calculators/A&P MDM Number of Diagnoses or Management Options Dehydration: new, needed workup Lower urinary tract infectious disease: new, needed workup Nausea vomiting and diarrhea: new, needed workup   Amount and/or Complexity of Data Reviewed Clinical lab tests: ordered and reviewed Tests in the medicine section of CPT: reviewed and ordered Decide to obtain previous medical records or to obtain history from someone other than the patient: yes Review and summarize past medical records: yes  Risk of Complications, Morbidity, and/or Mortality Presenting problems: high Diagnostic procedures: high Management options: high  Critical Care Total time providing critical care: < 30 minutes  Patient Progress Patient progress:  improved  Final Clinical Impression(s) / ED Diagnoses Final diagnoses:  Nausea vomiting and diarrhea  Dehydration  Lower urinary tract infectious disease    Rx / DC Orders ED Discharge Orders         Ordered    cephALEXin (KEFLEX) 500 MG capsule  2 times daily        09/17/20 1950           Gerhard Munch, MD 09/17/20 1951

## 2020-10-10 ENCOUNTER — Other Ambulatory Visit: Payer: Self-pay

## 2020-10-10 ENCOUNTER — Emergency Department (HOSPITAL_COMMUNITY)
Admission: EM | Admit: 2020-10-10 | Discharge: 2020-10-10 | Disposition: A | Discharge status: Home / Self Care | Payer: Self-pay | Attending: Emergency Medicine | Admitting: Emergency Medicine

## 2020-10-10 DIAGNOSIS — R112 Nausea with vomiting, unspecified: Secondary | ICD-10-CM | POA: Insufficient documentation

## 2020-10-10 DIAGNOSIS — F17200 Nicotine dependence, unspecified, uncomplicated: Secondary | ICD-10-CM | POA: Insufficient documentation

## 2020-10-10 DIAGNOSIS — R197 Diarrhea, unspecified: Secondary | ICD-10-CM | POA: Insufficient documentation

## 2020-10-10 DIAGNOSIS — R109 Unspecified abdominal pain: Secondary | ICD-10-CM | POA: Insufficient documentation

## 2020-10-10 LAB — CBC
HCT: 35.5 % — ABNORMAL LOW (ref 36.0–46.0)
Hemoglobin: 11.8 g/dL — ABNORMAL LOW (ref 12.0–15.0)
MCH: 30 pg (ref 26.0–34.0)
MCHC: 33.2 g/dL (ref 30.0–36.0)
MCV: 90.3 fL (ref 80.0–100.0)
Platelets: 210 10*3/uL (ref 150–400)
RBC: 3.93 MIL/uL (ref 3.87–5.11)
RDW: 16.8 % — ABNORMAL HIGH (ref 11.5–15.5)
WBC: 16.4 10*3/uL — ABNORMAL HIGH (ref 4.0–10.5)
nRBC: 0 % (ref 0.0–0.2)

## 2020-10-10 LAB — COMPREHENSIVE METABOLIC PANEL
ALT: 19 U/L (ref 0–44)
AST: 29 U/L (ref 15–41)
Albumin: 4.4 g/dL (ref 3.5–5.0)
Alkaline Phosphatase: 60 U/L (ref 38–126)
Anion gap: 9 (ref 5–15)
BUN: 12 mg/dL (ref 6–20)
CO2: 27 mmol/L (ref 22–32)
Calcium: 9.4 mg/dL (ref 8.9–10.3)
Chloride: 103 mmol/L (ref 98–111)
Creatinine, Ser: 0.57 mg/dL (ref 0.44–1.00)
GFR, Estimated: >60 mL/min (ref >60)
Glucose, Bld: 105 mg/dL — ABNORMAL HIGH (ref 70–99)
Potassium: 3.2 mmol/L — ABNORMAL LOW (ref 3.5–5.1)
Sodium: 139 mmol/L (ref 135–145)
Total Bilirubin: 0.3 mg/dL (ref 0.3–1.2)
Total Protein: 8.1 g/dL (ref 6.5–8.1)

## 2020-10-10 LAB — I-STAT BETA HCG BLOOD, ED (MC, WL, AP ONLY): I-stat hCG, quantitative: <5 m[IU]/mL (ref <5)

## 2020-10-10 LAB — LIPASE, BLOOD: Lipase: 31 U/L (ref 11–51)

## 2020-10-10 MED ORDER — KETOROLAC TROMETHAMINE 15 MG/ML IJ SOLN: 15.0000 mg | Freq: Once | INTRAMUSCULAR | Status: DC

## 2020-10-10 MED ORDER — ONDANSETRON HCL 4 MG/2ML IJ SOLN
4.0000 mg | Freq: Once | INTRAMUSCULAR | Status: AC
  Administered 2020-10-10: 4 mg via INTRAVENOUS
  Filled 2020-10-10: qty 2

## 2020-10-10 MED ORDER — HALOPERIDOL LACTATE 5 MG/ML IJ SOLN
2.0000 mg | Freq: Once | INTRAMUSCULAR | Status: AC
  Administered 2020-10-10: 2 mg via INTRAVENOUS
  Filled 2020-10-10: qty 1

## 2020-10-10 MED ORDER — KETOROLAC TROMETHAMINE 15 MG/ML IJ SOLN
15.0000 mg | Freq: Once | INTRAMUSCULAR | Status: AC
  Administered 2020-10-10: 15 mg via INTRAVENOUS
  Filled 2020-10-10: qty 1

## 2020-10-10 MED ORDER — SODIUM CHLORIDE 0.9 % IV BOLUS
1000.0000 mL | Freq: Once | INTRAVENOUS | Status: AC
  Administered 2020-10-10: 1000 mL via INTRAVENOUS

## 2020-10-10 MED ORDER — ONDANSETRON 4 MG PO TBDP: 4.0000 mg | ORAL_TABLET | Freq: Once | ORAL | Status: DC

## 2020-10-10 NOTE — ED Notes (Signed)
Discharge instructions reviewed and patient called her ride

## 2020-10-10 NOTE — ED Provider Notes (Signed)
Patient presents with recurrent abdominal pain.  Patient was signed out to me as pending repeat evaluation.  Her work-up that included labs and urinalysis was pregnancy negative but did show leukocytosis.  Patient describes the pain as cramping and located in the lower abdomen.  Scribes it is similar to her prior episodes of pain that surround the time of her menstruation.  Risks of leukocytosis for other etiology of abdominal pain including but not limited to acute appendicitis discussed.  However on my examination she states the pain is now gone she has no abdominal pain or tenderness.  Patient observed in the ER for several hours with serial abdominal exams done.  Last exam done at 7:50 AM with no abdominal tenderness or guarding noted.  Patient discharged home in stable condition, advised immediate return if she has worsening or recurrent abdominal pain otherwise follow-up with her OB/GYN doctor within the week.     Cheryll Cockayne, MD 10/10/20 [phone]

## 2020-10-10 NOTE — ED Provider Notes (Signed)
[facility]-EMERGENCY DEPT [facility] Emergency Department Provider Note MRN:  [id number]  Arrival date & time: 10/10/20     Chief Complaint   Abdominal pain History of Present Illness   Robin Flynn is a 30 y.o. year-old female with a history of chronic abdominal pain presenting to the ED with chief complaint of abdominal pain.  Location: Diffuse abdominal pain Duration: Several hours Onset: Gradual Timing: Constant Description: Dull Severity: 10 out of 10 Exacerbating/Alleviating Factors: None Associated Symptoms: Nausea vomiting and diarrhea Pertinent Negatives: Denies fever, no vaginal bleeding or discharge   Review of Systems  A complete 10 system review of systems was obtained and all systems are negative except as noted in the HPI and PMH.   Patient's Health History    Past Medical History:  Diagnosis Date  . Allergy     No past surgical history on file.  Family History  Problem Relation Age of Onset  . Cancer Paternal Grandmother        lung    Social History   Socioeconomic History  . Marital status: Significant Other    Spouse name: Not on file  . Number of children: Not on file  . Years of education: Not on file  . Highest education level: Not on file  Occupational History  . Not on file  Tobacco Use  . Smoking status: Current Every Day Smoker  . Smokeless tobacco: Never Used  Substance and Sexual Activity  . Alcohol use: Yes    Alcohol/week: 3.0 standard drinks    Types: 3 Standard drinks or equivalent per week  . Drug use: Yes    Types: Marijuana  . Sexual activity: Yes  Other Topics Concern  . Not on file  Social History Narrative  . Not on file   Social Determinants of Health   Financial Resource Strain: Not on file  Food Insecurity: Not on file  Transportation Needs: Not on file  Physical Activity: Not on file  Stress: Not on file  Social Connections: Not on file  Intimate Partner Violence: Not on file     Physical Exam    Vitals:   10/10/20 0557 10/10/20 0623  BP: (!) 122/57 112/61  Pulse: 71 (!) 44  Resp: 13 20  Temp:    SpO2: 100% 100%    CONSTITUTIONAL: Well-appearing, in moderate distress due to discomfort NEURO:  Alert and oriented x 3, no focal deficits EYES:  eyes equal and reactive ENT/NECK:  no LAD, no JVD CARDIO: Regular rate, well-perfused, normal S1 and S2 PULM:  CTAB no wheezing or rhonchi GI/GU:  normal bowel sounds, non-distended, non-tender MSK/SPINE:  No gross deformities, no edema SKIN:  no rash, atraumatic PSYCH:  Appropriate speech and behavior  *Additional and/or pertinent findings included in MDM below  Diagnostic and Interventional Summary    EKG Interpretation  Date/Time:    Ventricular Rate:    PR Interval:    QRS Duration:   QT Interval:    QTC Calculation:   R Axis:     Text Interpretation:        Labs Reviewed  CBC - Abnormal; Notable for the following components:      Result Value   WBC 16.4 (*)    Hemoglobin 11.8 (*)    HCT 35.5 (*)    RDW 16.8 (*)    All other components within normal limits  COMPREHENSIVE METABOLIC PANEL - Abnormal; Notable for the following components:   Potassium 3.2 (*)    Glucose, Bld 105 (*)  All other components within normal limits  LIPASE, BLOOD  I-STAT BETA HCG BLOOD, ED ([facility], [facility], [facility] ONLY)    No orders to display    Medications  haloperidol lactate (HALDOL) injection 2 mg (has no administration in time range)  sodium chloride 0.9 % bolus 1,000 mL (1,000 mLs Intravenous New Bag/Given 10/10/20 0552)  sodium chloride 0.9 % bolus 1,000 mL (1,000 mLs Intravenous New Bag/Given 10/10/20 0552)  ondansetron (ZOFRAN) injection 4 mg (4 mg Intravenous Given 10/10/20 0551)  ketorolac (TORADOL) 15 MG/ML injection 15 mg (15 mg Intravenous Given 10/10/20 0622)     Procedures  /  Critical Care Procedures  ED Course and Medical Decision Making  I have reviewed the triage vital signs, the nursing notes, and pertinent available records  from the EMR.  Listed above are laboratory and imaging tests that I personally ordered, reviewed, and interpreted and then considered in my medical decision making (see below for details).  Repeated ED visits for pain in the setting of menstrual cycle.  Hypotensive on arrival but well-perfused, suspect a component of dehydration given the frequent vomiting.  Providing fluids, nausea medicine.  Will monitor closely.     On reassessment patient continues to be uncomfortable, will provide Haldol and will need reassessment.  Signed out to oncoming provider at shift change.  Goal is to manage symptoms enough for tolerating p.o. and then having her follow-up with OB/GYN.  Elmer Sow. Pilar Plate, MD [facility] Emergency Medicine [facility] [email or web address]  Final Clinical Impressions(s) / ED Diagnoses     ICD-10-CM   1. Nausea vomiting and diarrhea  R11.2    R19.7   2. Abdominal pain, unspecified abdominal location  R10.9     ED Discharge Orders    None       Discharge Instructions Discussed with and Provided to Patient:   Discharge Instructions   None       Sabas Sous, MD 10/10/20 [phone]

## 2020-10-10 NOTE — Discharge Instructions (Addendum)
Call your primary care doctor or specialist as discussed in the next 2-3 days.   Return immediately back to the ER if:  Your symptoms worsen within the next 12-24 hours. You develop new symptoms such as new fevers, persistent vomiting, new pain, shortness of breath, or new weakness or numbness, or if you have any other concerns.  

## 2020-10-10 NOTE — ED Triage Notes (Signed)
[facility] from home. Pt has 10/10 abdominal pain with nausea and vomiting during her menstrual cycle. Pt has met with gynecologist and is in the midst of being treated for this issue.

## 2020-12-20 ENCOUNTER — Encounter (HOSPITAL_BASED_OUTPATIENT_CLINIC_OR_DEPARTMENT_OTHER): Payer: Self-pay

## 2020-12-20 ENCOUNTER — Other Ambulatory Visit: Payer: Self-pay

## 2020-12-20 ENCOUNTER — Emergency Department (HOSPITAL_BASED_OUTPATIENT_CLINIC_OR_DEPARTMENT_OTHER)
Admission: EM | Admit: 2020-12-20 | Discharge: 2020-12-20 | Disposition: A | Discharge status: Home / Self Care | Payer: Self-pay | Attending: Emergency Medicine | Admitting: Emergency Medicine

## 2020-12-20 ENCOUNTER — Emergency Department (HOSPITAL_BASED_OUTPATIENT_CLINIC_OR_DEPARTMENT_OTHER): Payer: Self-pay

## 2020-12-20 DIAGNOSIS — F17210 Nicotine dependence, cigarettes, uncomplicated: Secondary | ICD-10-CM | POA: Insufficient documentation

## 2020-12-20 DIAGNOSIS — R1032 Left lower quadrant pain: Secondary | ICD-10-CM

## 2020-12-20 DIAGNOSIS — N7011 Chronic salpingitis: Secondary | ICD-10-CM

## 2020-12-20 DIAGNOSIS — N7091 Salpingitis, unspecified: Secondary | ICD-10-CM | POA: Insufficient documentation

## 2020-12-20 LAB — URINALYSIS, ROUTINE W REFLEX MICROSCOPIC
Bilirubin Urine: NEGATIVE
Glucose, UA: NEGATIVE mg/dL
Ketones, ur: 15 mg/dL — AB
Nitrite: NEGATIVE
Protein, ur: 30 mg/dL — AB
RBC / HPF: >50 RBC/hpf — ABNORMAL HIGH (ref 0–5)
Specific Gravity, Urine: 1.03 (ref 1.005–1.030)
pH: 5.5 (ref 5.0–8.0)

## 2020-12-20 LAB — COMPREHENSIVE METABOLIC PANEL
ALT: 12 U/L (ref 0–44)
AST: 24 U/L (ref 15–41)
Albumin: 4.2 g/dL (ref 3.5–5.0)
Alkaline Phosphatase: 52 U/L (ref 38–126)
Anion gap: 12 (ref 5–15)
BUN: 10 mg/dL (ref 6–20)
CO2: 23 mmol/L (ref 22–32)
Calcium: 9.3 mg/dL (ref 8.9–10.3)
Chloride: 103 mmol/L (ref 98–111)
Creatinine, Ser: 0.58 mg/dL (ref 0.44–1.00)
GFR, Estimated: >60 mL/min (ref >60)
Glucose, Bld: 96 mg/dL (ref 70–99)
Potassium: 3.8 mmol/L (ref 3.5–5.1)
Sodium: 138 mmol/L (ref 135–145)
Total Bilirubin: 0.8 mg/dL (ref 0.3–1.2)
Total Protein: 7.6 g/dL (ref 6.5–8.1)

## 2020-12-20 LAB — CBC
HCT: 33.7 % — ABNORMAL LOW (ref 36.0–46.0)
Hemoglobin: 11.3 g/dL — ABNORMAL LOW (ref 12.0–15.0)
MCH: 29.9 pg (ref 26.0–34.0)
MCHC: 33.5 g/dL (ref 30.0–36.0)
MCV: 89.2 fL (ref 80.0–100.0)
Platelets: 196 10*3/uL (ref 150–400)
RBC: 3.78 MIL/uL — ABNORMAL LOW (ref 3.87–5.11)
RDW: 17 % — ABNORMAL HIGH (ref 11.5–15.5)
WBC: 15.8 10*3/uL — ABNORMAL HIGH (ref 4.0–10.5)
nRBC: 0 % (ref 0.0–0.2)

## 2020-12-20 LAB — PREGNANCY, URINE: Preg Test, Ur: NEGATIVE

## 2020-12-20 LAB — LIPASE, BLOOD: Lipase: <10 U/L — ABNORMAL LOW (ref 11–51)

## 2020-12-20 LAB — TROPONIN I (HIGH SENSITIVITY): Troponin I (High Sensitivity): 6 ng/L (ref <18)

## 2020-12-20 MED ORDER — KETOROLAC TROMETHAMINE 15 MG/ML IJ SOLN
15.0000 mg | Freq: Once | INTRAMUSCULAR | Status: AC
  Administered 2020-12-20: 15 mg via INTRAVENOUS
  Filled 2020-12-20: qty 1

## 2020-12-20 MED ORDER — ONDANSETRON HCL 4 MG/2ML IJ SOLN
INTRAMUSCULAR | Status: AC
  Administered 2020-12-20: 4 mg
  Filled 2020-12-20: qty 2

## 2020-12-20 MED ORDER — NAPROXEN 500 MG PO TABS: 500.0000 mg | ORAL_TABLET | Freq: Two times a day (BID) | ORAL | 0 refills | Status: AC

## 2020-12-20 MED ORDER — SODIUM CHLORIDE 0.9 % IV BOLUS
1000.0000 mL | Freq: Once | INTRAVENOUS | Status: AC
  Administered 2020-12-20: 1000 mL via INTRAVENOUS

## 2020-12-20 NOTE — ED Notes (Signed)
Pt verbalizes understanding of discharge instructions. Opportunity for questioning and answers were provided. Armand removed by staff, pt discharged from ED to home. Educated to pick up Rx.  

## 2020-12-20 NOTE — Discharge Instructions (Addendum)
Call your primary care doctor or specialist as discussed in the next 2-3 days.   Return immediately back to the ER if:  Your symptoms worsen within the next 12-24 hours. You develop new symptoms such as new fevers, persistent vomiting, new pain, shortness of breath, or new weakness or numbness, or if you have any other concerns.  

## 2020-12-20 NOTE — ED Notes (Signed)
Patient transported to [facility] 

## 2020-12-20 NOTE — ED Triage Notes (Addendum)
Patient BIB [facility] for Pelvic Pain and CP since this AM.  Patient has Pain radiating from Mid Pelvis through to Mid Chest. Patient states she gets these symptoms once per year due to Ovarian Cysts.  BIB Stretcher. GCS 15. A&Ox4. Patient in Obvious Discomfort. 4 mg IV Zofran given by EMS.

## 2020-12-20 NOTE — ED Notes (Signed)
Pt returned from [place]. Pt given water and ginger ale to attempt to intake PO fluids.

## 2020-12-20 NOTE — ED Provider Notes (Signed)
[facility] EMERGENCY DEPT Provider Note   CSN: [id number] Arrival date & time: 12/20/20  2001     History Chief Complaint  Patient presents with   Pelvic Pain   Abdominal Pain    Robin Flynn is a 30 y.o. female.  Patient presents with chief complaint of left lower quadrant abdominal pain.  She states that she is on multiple episodes of similar pain in the last year.  She feels it similar to prior episodes described as sharp and aching in the left lower quadrant nonradiating.  No reports of fevers or cough or diarrhea.  Positive vomiting today nonbloody nonbilious.        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 No pertinent surgical history.   OB History   No obstetric history on file.     Family History  Problem Relation Age of Onset   Cancer Paternal Grandmother        lung    Social History   Tobacco Use   Smoking status: Every Day    Packs/day: 0.20    Types: Cigarettes   Smokeless tobacco: Never  Substance Use Topics   Alcohol use: Yes    Alcohol/week: 3.0 standard drinks    Types: 3 Standard drinks or equivalent per week   Drug use: Yes    Frequency: 1.0 times per week    Types: Marijuana    Home Medications Prior to Admission medications   Medication Sig Start Date End Date Taking? Authorizing Provider  naproxen (NAPROSYN) 500 MG tablet Take 1 tablet (500 mg total) by mouth 2 (two) times daily for 7 days. 12/20/20 12/27/20 Yes Cheryll Cockayne, MD  famotidine (PEPCID) 20 MG tablet Take 1 tablet (20 mg total) by mouth 2 (two) times daily. Patient not taking: Reported on 04/22/2019 11/08/18   Sandre Kitty, MD  lidocaine (LIDODERM) 5 % Place 1 patch onto the skin daily. Remove & Discard patch within 12 hours or as directed by MD 08/23/20   Haskel Schroeder, PA-C  loratadine (CLARITIN) 10 MG tablet Take 10 mg by mouth daily as needed for allergies.    [provider]  medroxyPROGESTERone (DEPO-PROVERA) 150 MG/ML injection Inject 1 mL (150 mg total) into the muscle every 3 (three) months. Patient not taking: Reported on 10/31/2018 07/03/15   Reva Bores, MD  meloxicam (MOBIC) 15 MG tablet Take 1 tablet (15 mg total) by mouth daily as needed for pain. Patient not taking: Reported on 08/01/2019 06/01/19   Sandre Kitty, MD  methocarbamol (ROBAXIN) 500 MG tablet Take 1 tablet (500 mg total) by mouth every 8 (eight) hours as needed for muscle spasms. 08/23/20   Haskel Schroeder, PA-C  ondansetron (ZOFRAN-ODT) 4 MG disintegrating tablet TAKE 1 TABLET BY MOUTH EVERY 8 HOURS AS NEEDED 05/19/20 05/19/21  Long, Arlyss Repress, MD  pantoprazole (PROTONIX) 40 MG tablet Take 1 tablet (40 mg total) by mouth daily. Patient not taking: Reported on 10/31/2018 03/02/16   Almon Hercules, MD  prochlorperazine (COMPAZINE) 5 MG tablet Take 1 tablet (5 mg total) by mouth every 6 (six) hours  as needed for nausea or vomiting. 08/01/19   Oralia Manis, DO    Allergies    Patient has no known allergies.  Review of Systems   Review of Systems  Constitutional:  Negative for fever.  HENT:  Negative for ear pain.   Eyes:  Negative for pain.  Respiratory:  Negative for cough.   Cardiovascular:  Negative for chest pain.  Gastrointestinal:  Positive for abdominal pain.  Genitourinary:  Negative for flank pain.  Musculoskeletal:  Negative for back pain.  Skin:  Negative for rash.  Neurological:  Negative for headaches.   Physical Exam Updated Vital Signs BP 112/60   Pulse (!) 50   Temp 98.2 F (36.8 C) (Oral)   Resp 18   Ht 5\' 4"  (1.626 m)   Wt 50.8 kg   LMP 12/20/2020   SpO2 100%   BMI 19.22  kg/m   Physical Exam Constitutional:      General: She is not in acute distress.    Appearance: Normal appearance.  HENT:     Head: Normocephalic.     Nose: Nose normal.  Eyes:     Extraocular Movements: Extraocular movements intact.  Cardiovascular:     Rate and Rhythm: Normal rate.  Pulmonary:     Effort: Pulmonary effort is normal.  Abdominal:     Comments: Moderate tenderness in the left lower quadrant.  No guarding or rebound.  Musculoskeletal:        General: Normal range of motion.     Cervical back: Normal range of motion.  Neurological:     General: No focal deficit present.     Mental Status: She is alert. Mental status is at baseline.    ED Results / Procedures / Treatments   Labs (all labs ordered are listed, but only abnormal results are displayed) Labs Reviewed  CBC - Abnormal; Notable for the following components:      Result Value   WBC 15.8 (*)    RBC 3.78 (*)    Hemoglobin 11.3 (*)    HCT 33.7 (*)    RDW 17.0 (*)    All other components within normal limits  LIPASE, BLOOD - Abnormal; Notable for the following components:   Lipase <10 (*)    All other components within normal limits  URINALYSIS, ROUTINE W REFLEX MICROSCOPIC - Abnormal; Notable for the following components:   APPearance HAZY (*)    Hgb urine dipstick LARGE (*)    Ketones, ur 15 (*)    Protein, ur 30 (*)    Leukocytes,Ua TRACE (*)    RBC / HPF >50 (*)    All other components within normal limits  COMPREHENSIVE METABOLIC PANEL  PREGNANCY, URINE  TROPONIN I (HIGH SENSITIVITY)  TROPONIN I (HIGH SENSITIVITY)    EKG EKG Interpretation  Date/Time:  Saturday December 20 2020 20:14:34 EDT Ventricular Rate:  47 PR Interval:  166 QRS Duration: 64 QT Interval:  456 QTC Calculation: 403 R Axis:   63 Text Interpretation: Sinus bradycardia Otherwise normal ECG Confirmed by [id number] ([id number]) on 12/20/2020 8:42:22 PM  Radiology 12/22/2020 PELVIC COMPLETE W TRANSVAGINAL AND TORSION  R/O  Result Date: 12/20/2020 CLINICAL DATA:  Pelvic pain, nausea/vomiting, diarrhea EXAM: TRANSABDOMINAL AND TRANSVAGINAL ULTRASOUND OF PELVIS DOPPLER ULTRASOUND OF OVARIES TECHNIQUE: Both transabdominal and transvaginal ultrasound examinations of the pelvis were performed. Transabdominal technique was performed for global imaging of the pelvis including uterus, ovaries, adnexal regions, and pelvic cul-de-sac. It was necessary to proceed with endovaginal exam following  the transabdominal exam to visualize the endometrium and bilateral ovaries. Color and duplex Doppler ultrasound was utilized to evaluate blood flow to the ovaries. COMPARISON:  None. FINDINGS: Uterus Measurements: 6.8 x 4.2 x 4.2 cm = volume: 63 mL. No fibroids or other mass visualized. Endometrium Thickness: 13 mm.  No focal abnormality visualized. Right ovary Measurements: 4.7 x 2.4 x 1.9 cm = volume: 10.9 mL. Normal appearance/no adnexal mass. Left ovary Measurements: 2.9 x 1.3 x 2.2 cm = volume: 4.2 mL. Mildly prominent tubular structure in the left adnexa. Pulsed Doppler evaluation of both ovaries demonstrates normal low-resistance arterial and venous waveforms. Other findings Small to moderate pelvic fluid, particularly in the left adnexa. IMPRESSION: Mildly prominent tubular structure in the left adnexa, raising the possibility of a hydrosalpinx. Small to moderate pelvic fluid, particularly in the left adnexa, likely physiologic. No evidence of ovarian torsion. Electronically Signed   By: Charline Bills M.D.   On: 12/20/2020 21:47    Procedures Procedures   Medications Ordered in ED Medications  ondansetron (ZOFRAN) 4 MG/2ML injection (4 mg  Given 12/20/20 2054)  ketorolac (TORADOL) 15 MG/ML injection 15 mg (15 mg Intravenous Given 12/20/20 2054)  sodium chloride 0.9 % bolus 1,000 mL (1,000 mLs Intravenous New Bag/Given 12/20/20 2056)    ED Course  I have reviewed the triage vital signs and the nursing notes.  Pertinent labs &  imaging results that were available during my care of the patient were reviewed by me and considered in my medical decision making (see chart for details).    MDM Rules/Calculators/A&P                           Patient symptoms improved with Toradol and IV fluids.  Labs show persistent leukocytosis which appears chronic from prior evaluations in past records.  Ultrasound shows evidence of possible mild hydrosalpinx in the left and mild fluid in the pelvis on the left.  Given improvement of symptoms with Toradol and patient evaluation I doubt torsion.  Patient's pain appears chronic requiring multiple times in the course of the last year.  We will recommend continued close follow-up with her OB/GYN doctor within the week.  Recommending immediate return for fevers worsening pain or any additional concerns.  Final Clinical Impression(s) / ED Diagnoses Final diagnoses:  Hydrosalpinx    Rx / DC Orders ED Discharge Orders          Ordered    naproxen (NAPROSYN) 500 MG tablet  2 times daily        12/20/20 2158             Cheryll Cockayne, MD 12/20/20 2158

## 2020-12-20 NOTE — ED Notes (Signed)
Pt tolerated PO fluids

## 2020-12-22 ENCOUNTER — Emergency Department (HOSPITAL_BASED_OUTPATIENT_CLINIC_OR_DEPARTMENT_OTHER)
Admission: EM | Admit: 2020-12-22 | Discharge: 2020-12-22 | Disposition: A | Discharge status: Home / Self Care | Payer: Self-pay | Attending: Emergency Medicine | Admitting: Emergency Medicine

## 2020-12-22 ENCOUNTER — Ambulatory Visit: Payer: Self-pay | Admitting: *Deleted

## 2020-12-22 ENCOUNTER — Encounter (HOSPITAL_BASED_OUTPATIENT_CLINIC_OR_DEPARTMENT_OTHER): Payer: Self-pay

## 2020-12-22 ENCOUNTER — Other Ambulatory Visit: Payer: Self-pay

## 2020-12-22 DIAGNOSIS — R112 Nausea with vomiting, unspecified: Secondary | ICD-10-CM | POA: Insufficient documentation

## 2020-12-22 DIAGNOSIS — Z5321 Procedure and treatment not carried out due to patient leaving prior to being seen by health care provider: Secondary | ICD-10-CM | POA: Insufficient documentation

## 2020-12-22 DIAGNOSIS — R109 Unspecified abdominal pain: Secondary | ICD-10-CM | POA: Insufficient documentation

## 2020-12-22 LAB — COMPREHENSIVE METABOLIC PANEL
ALT: 18 U/L (ref 0–44)
AST: 26 U/L (ref 15–41)
Albumin: 3.9 g/dL (ref 3.5–5.0)
Alkaline Phosphatase: 45 U/L (ref 38–126)
Anion gap: 9 (ref 5–15)
BUN: 8 mg/dL (ref 6–20)
CO2: 26 mmol/L (ref 22–32)
Calcium: 8.8 mg/dL — ABNORMAL LOW (ref 8.9–10.3)
Chloride: 105 mmol/L (ref 98–111)
Creatinine, Ser: 0.53 mg/dL (ref 0.44–1.00)
GFR, Estimated: >60 mL/min (ref >60)
Glucose, Bld: 92 mg/dL (ref 70–99)
Potassium: 3.2 mmol/L — ABNORMAL LOW (ref 3.5–5.1)
Sodium: 140 mmol/L (ref 135–145)
Total Bilirubin: 0.6 mg/dL (ref 0.3–1.2)
Total Protein: 6.9 g/dL (ref 6.5–8.1)

## 2020-12-22 LAB — CBC
HCT: 29.4 % — ABNORMAL LOW (ref 36.0–46.0)
Hemoglobin: 9.9 g/dL — ABNORMAL LOW (ref 12.0–15.0)
MCH: 30 pg (ref 26.0–34.0)
MCHC: 33.7 g/dL (ref 30.0–36.0)
MCV: 89.1 fL (ref 80.0–100.0)
Platelets: 177 10*3/uL (ref 150–400)
RBC: 3.3 MIL/uL — ABNORMAL LOW (ref 3.87–5.11)
RDW: 16.9 % — ABNORMAL HIGH (ref 11.5–15.5)
WBC: 10.9 10*3/uL — ABNORMAL HIGH (ref 4.0–10.5)
nRBC: 0 % (ref 0.0–0.2)

## 2020-12-22 LAB — LIPASE, BLOOD: Lipase: 11 U/L (ref 11–51)

## 2020-12-22 NOTE — Telephone Encounter (Signed)
Patient is calling severe vomiting- she is unable to drink or eat- heaving on the phone- advised ED- she does not have transport- advised 911- she request I call for her- address and phone number verified and 911 called.

## 2020-12-22 NOTE — ED Triage Notes (Signed)
Pt arrived by [facility] with 2 day history of abdominal pain, nausea and vomiting.  Pt states she was seen and treated at this ED on Saturday for the same symptoms.  States she woke up this morning and symptoms were worse.

## 2020-12-22 NOTE — Telephone Encounter (Signed)
Reason for Disposition  [1] SEVERE vomiting (e.g., 6 or more times/day) AND [2] present > 8 hours (Exception: patient sounds well, is drinking liquids, does not sound dehydrated, and vomiting has lasted less than 24 hours)  Answer Assessment - Initial Assessment Questions 1. VOMITING SEVERITY: "How many times have you vomited in the past 24 hours?"     - MILD:  1 - 2 times/day    - MODERATE: 3 - 5 times/day, decreased oral intake without significant weight loss or symptoms of dehydration    - SEVERE: 6 or more times/day, vomits everything or nearly everything, with significant weight loss, symptoms of dehydration      severe 2. ONSET: "When did the vomiting begin?"      Patient was seen at ED for vomiting- was better yesterday- vomiting 3. FLUIDS: "What fluids or food have you vomited up today?" "Have you been able to keep any fluids down?"     No patient has been vomiting all day 4. ABDOMINAL PAIN: "Are your having any abdominal pain?" If yes : "How bad is it and what does it feel like?" (e.g., crampy, dull, intermittent, constant)      yes Patient is unable to have conversation due to heaving reflex- severe abdominal pain. Patient does not have transportation to ED- advised call 911- she asked me to call them for her. 911 called and they are on they way.  Protocols used: Vomiting-A-AH

## 2021-10-13 ENCOUNTER — Encounter: Payer: Self-pay | Admitting: *Deleted
